# Patient Record
Sex: Female | Born: 1975 | Race: White | Hispanic: Yes | Marital: Single | Smoking: Current every day smoker
Health system: Southern US, Community
[De-identification: ages and names within clinical notes are randomized; demographics above are authoritative.]

## PROBLEM LIST (undated history)

## (undated) DIAGNOSIS — F329 Major depressive disorder, single episode, unspecified: Secondary | ICD-10-CM

## (undated) DIAGNOSIS — R102 Pelvic and perineal pain: Secondary | ICD-10-CM

## (undated) DIAGNOSIS — G8929 Other chronic pain: Secondary | ICD-10-CM

## (undated) DIAGNOSIS — F32A Depression, unspecified: Secondary | ICD-10-CM

## (undated) DIAGNOSIS — D219 Benign neoplasm of connective and other soft tissue, unspecified: Secondary | ICD-10-CM

## (undated) DIAGNOSIS — N83209 Unspecified ovarian cyst, unspecified side: Secondary | ICD-10-CM

## (undated) DIAGNOSIS — B009 Herpesviral infection, unspecified: Secondary | ICD-10-CM

## (undated) HISTORY — PX: TUBAL LIGATION: SHX77

## (undated) HISTORY — PX: TONSILLECTOMY: SUR1361

## (undated) HISTORY — PX: OTHER SURGICAL HISTORY: SHX169

## (undated) HISTORY — DX: Herpesviral infection, unspecified: B00.9

---

## 2013-12-30 ENCOUNTER — Ambulatory Visit (INDEPENDENT_AMBULATORY_CARE_PROVIDER_SITE_OTHER): Payer: Medicaid Other | Admitting: Obstetrics

## 2013-12-30 ENCOUNTER — Encounter: Payer: Self-pay | Admitting: Obstetrics

## 2013-12-30 VITALS — BP 121/82 | HR 66 | Temp 99.2°F | Ht 68.0 in | Wt 182.0 lb

## 2013-12-30 DIAGNOSIS — Z113 Encounter for screening for infections with a predominantly sexual mode of transmission: Secondary | ICD-10-CM

## 2013-12-30 DIAGNOSIS — N926 Irregular menstruation, unspecified: Secondary | ICD-10-CM

## 2013-12-30 NOTE — Progress Notes (Signed)
Subjective:     Stacie Brown is a 38 y.o. female here for a routine exam.  Current complaints: Patient is new to the area and would like to establish care. Patient is due for annual pap and would like STD testing..  Personal health questionnaire reviewed: yes.   Gynecologic History Patient's last menstrual period was 12/25/2013. Contraception: none Last Pap: 02/2013. Results were: normal Last mammogram: never.   Obstetric History OB History  No data available     The following portions of the patient's history were reviewed and updated as appropriate: allergies, current medications, past family history, past medical history, past social history, past surgical history and problem list.  Review of Systems Pertinent items are noted in HPI.    Objective:    General appearance: alert and no distress Abdomen: normal findings: soft, non-tender Pelvic: cervix normal in appearance, external genitalia normal, no adnexal masses or tenderness, no cervical motion tenderness, rectovaginal septum normal, uterus normal size, shape, and consistency and vagina normal without discharge    Assessment:    Healthy female exam.   Screening for venereal disease   Plan:    Education reviewed: safe sex/STD prevention. Contraception: none. Follow up in: several months. STD Panel

## 2013-12-31 LAB — HEPATITIS B SURFACE ANTIGEN: Hepatitis B Surface Ag: NEGATIVE

## 2013-12-31 LAB — GC/CHLAMYDIA PROBE AMP
CT Probe RNA: NEGATIVE
GC Probe RNA: NEGATIVE

## 2013-12-31 LAB — WET PREP BY MOLECULAR PROBE
CANDIDA SPECIES: NEGATIVE
GARDNERELLA VAGINALIS: POSITIVE — AB
TRICHOMONAS VAG: NEGATIVE

## 2013-12-31 LAB — HIV ANTIBODY (ROUTINE TESTING W REFLEX): HIV: NONREACTIVE

## 2013-12-31 LAB — HEPATITIS C ANTIBODY: HCV Ab: NEGATIVE

## 2013-12-31 LAB — RPR

## 2014-01-05 ENCOUNTER — Other Ambulatory Visit: Payer: Self-pay | Admitting: *Deleted

## 2014-01-05 DIAGNOSIS — N760 Acute vaginitis: Principal | ICD-10-CM

## 2014-01-05 DIAGNOSIS — B9689 Other specified bacterial agents as the cause of diseases classified elsewhere: Secondary | ICD-10-CM

## 2014-01-05 MED ORDER — METRONIDAZOLE 500 MG PO TABS: 500.0000 mg | ORAL_TABLET | Freq: Two times a day (BID) | ORAL | Status: DC

## 2014-02-26 ENCOUNTER — Encounter (HOSPITAL_COMMUNITY): Payer: Self-pay | Admitting: Emergency Medicine

## 2014-02-26 ENCOUNTER — Emergency Department (HOSPITAL_COMMUNITY)
Admission: EM | Admit: 2014-02-26 | Discharge: 2014-02-27 | Disposition: A | Discharge status: Other Acute Inpatient Hospital | Payer: Medicaid Other | Attending: Emergency Medicine | Admitting: Emergency Medicine

## 2014-02-26 DIAGNOSIS — F3289 Other specified depressive episodes: Secondary | ICD-10-CM | POA: Insufficient documentation

## 2014-02-26 DIAGNOSIS — F32A Depression, unspecified: Secondary | ICD-10-CM

## 2014-02-26 DIAGNOSIS — Z3202 Encounter for pregnancy test, result negative: Secondary | ICD-10-CM | POA: Insufficient documentation

## 2014-02-26 DIAGNOSIS — Z79899 Other long term (current) drug therapy: Secondary | ICD-10-CM | POA: Insufficient documentation

## 2014-02-26 DIAGNOSIS — F1210 Cannabis abuse, uncomplicated: Secondary | ICD-10-CM | POA: Insufficient documentation

## 2014-02-26 DIAGNOSIS — R45851 Suicidal ideations: Secondary | ICD-10-CM | POA: Insufficient documentation

## 2014-02-26 DIAGNOSIS — F411 Generalized anxiety disorder: Secondary | ICD-10-CM | POA: Insufficient documentation

## 2014-02-26 DIAGNOSIS — F329 Major depressive disorder, single episode, unspecified: Secondary | ICD-10-CM | POA: Insufficient documentation

## 2014-02-26 DIAGNOSIS — F17200 Nicotine dependence, unspecified, uncomplicated: Secondary | ICD-10-CM | POA: Insufficient documentation

## 2014-02-26 DIAGNOSIS — Z8619 Personal history of other infectious and parasitic diseases: Secondary | ICD-10-CM | POA: Insufficient documentation

## 2014-02-26 HISTORY — DX: Major depressive disorder, single episode, unspecified: F32.9

## 2014-02-26 HISTORY — DX: Depression, unspecified: F32.A

## 2014-02-26 LAB — RAPID URINE DRUG SCREEN, HOSP PERFORMED
AMPHETAMINES: NOT DETECTED
BARBITURATES: NOT DETECTED
BENZODIAZEPINES: NOT DETECTED
Cocaine: NOT DETECTED
Opiates: NOT DETECTED
Tetrahydrocannabinol: POSITIVE — AB

## 2014-02-26 LAB — COMPREHENSIVE METABOLIC PANEL
ALBUMIN: 3.2 g/dL — AB (ref 3.5–5.2)
ALT: 12 U/L (ref 0–35)
AST: 13 U/L (ref 0–37)
Alkaline Phosphatase: 55 U/L (ref 39–117)
BUN: 11 mg/dL (ref 6–23)
CO2: 26 meq/L (ref 19–32)
Calcium: 9 mg/dL (ref 8.4–10.5)
Chloride: 103 mEq/L (ref 96–112)
Creatinine, Ser: 0.88 mg/dL (ref 0.50–1.10)
GFR calc Af Amer: >90 mL/min (ref >90)
GFR, EST NON AFRICAN AMERICAN: 83 mL/min — AB (ref >90)
Glucose, Bld: 105 mg/dL — ABNORMAL HIGH (ref 70–99)
POTASSIUM: 3.6 meq/L — AB (ref 3.7–5.3)
SODIUM: 139 meq/L (ref 137–147)
Total Protein: 5.5 g/dL — ABNORMAL LOW (ref 6.0–8.3)

## 2014-02-26 LAB — CBC
HEMATOCRIT: 40 % (ref 36.0–46.0)
Hemoglobin: 13.8 g/dL (ref 12.0–15.0)
MCH: 29.7 pg (ref 26.0–34.0)
MCHC: 34.5 g/dL (ref 30.0–36.0)
MCV: 86.2 fL (ref 78.0–100.0)
Platelets: 218 10*3/uL (ref 150–400)
RBC: 4.64 MIL/uL (ref 3.87–5.11)
RDW: 12.3 % (ref 11.5–15.5)
WBC: 7.2 10*3/uL (ref 4.0–10.5)

## 2014-02-26 LAB — ACETAMINOPHEN LEVEL

## 2014-02-26 LAB — SALICYLATE LEVEL

## 2014-02-26 LAB — POC URINE PREG, ED: Preg Test, Ur: NEGATIVE

## 2014-02-26 LAB — ETHANOL: Alcohol, Ethyl (B): <11 mg/dL (ref 0–11)

## 2014-02-26 NOTE — ED Notes (Signed)
Called lab for bloodwork results.

## 2014-02-26 NOTE — ED Notes (Signed)
Pt reports a history of depression. She has been working with a  Teacher, music on a care plan but this week things got worse and she is feeling suicidal so she came to ED today for help. She has not tried to kill herself but thought of taking too many of her pills. Denies HI. She is having a lot of family problems at home. Denies any substance use

## 2014-02-26 NOTE — ED Notes (Signed)
Pt to receive supper tray.
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
## 2014-02-26 NOTE — ED Notes (Signed)
Dr Stevie Kern given lab results.  [place] notified.  [facility] called for transport.

## 2014-02-26 NOTE — ED Provider Notes (Signed)
CSN: [id number]     Arrival date & time 02/26/14  1616 History   First MD Initiated Contact with Patient 02/26/14 1634     Chief Complaint  Patient presents with  . Depression     (Consider location/radiation/quality/duration/timing/severity/associated sxs/prior Treatment) HPI 38 year old female with history of anxiety depression bipolar disorder worst impression this week with suicidal ideation with thoughts overdose the last few days talk to her psychiatric counselor who advised the patient come to the emergency department. The patient denies any threats to harm others denies any hallucinations denies any other medical concerns at this time. There is no treatment prior to arrival. Patient denies overdose prior to arrival. Her depression is severe. Past Medical History  Diagnosis Date  . Herpes   . Depression    Past Surgical History  Procedure Laterality Date  . Laproscopic Bilateral     cyst on tubes removed  . Tubal ligation    . Tonsillectomy     Family History  Problem Relation Age of Onset  . Diabetes Mother   . Asthma Mother   . Cancer Mother   . Heart disease Father    History  Substance Use Topics  . Smoking status: Heavy Tobacco Smoker -- 0.50 packs/day    Types: Cigarettes  . Smokeless tobacco: Not on file  . Alcohol Use: No   OB History   Grav Para Term Preterm Abortions TAB SAB Ect Mult Living                 Review of Systems 10 Systems reviewed and are negative for acute change except as noted in the HPI.   Allergies  Morphine and related  Home Medications   Prior to Admission medications   Medication Sig Start Date End Date Taking? Authorizing Provider  metroNIDAZOLE (FLAGYL) 500 MG tablet Take 1 tablet (500 mg total) by mouth 2 (two) times daily. 01/05/14   Shelly Bombard, MD   BP 93/55  Pulse 66  Temp(Src) 97.7 F (36.5 C) (Oral)  Resp 20  Ht 5' 8"  (1.727 m)  Wt 178 lb 12.8 oz (81.103 kg)  BMI 27.19 kg/m2  SpO2 99%  LMP  02/25/2014 Physical Exam  Nursing note and vitals reviewed. Constitutional:  Awake, alert, nontoxic appearance.  HENT:  Head: Atraumatic.  Eyes: Right eye exhibits no discharge. Left eye exhibits no discharge.  Neck: Neck supple.  Cardiovascular: Normal rate and regular rhythm.   No murmur heard. Pulmonary/Chest: Effort normal and breath sounds normal. No respiratory distress. She has no wheezes. She has no rales. She exhibits no tenderness.  Abdominal: Soft. There is no tenderness. There is no rebound.  Musculoskeletal: She exhibits no tenderness.  Baseline ROM, no obvious new focal weakness.  Neurological: She is alert.  Mental status and motor strength appears baseline for patient and situation.  Skin: No rash noted.  Psychiatric:  Appears somewhat anxious and depressed with suicidal ideation but states she will cooperate and will not harm her self in the emergency department denies any threats to harm others denies any hallucinations; speech is clear and does not appear pressured or tangential    ED Course  Procedures (including critical care time) Accepted at [facility] and Pt agrees. [place] RAPID DRUG SCREEN (HOSP PERFORMED) - Abnormal; Notable for the following:    Tetrahydrocannabinol POSITIVE (*)    All other components within normal limits  COMPREHENSIVE METABOLIC PANEL - Abnormal; Notable for the following:    Potassium  3.6 (*)    Glucose, Bld 105 (*)    Total Protein 5.5 (*)    Albumin 3.2 (*)    Total Bilirubin <0.2 (*)    GFR calc non Af Amer 83 (*)    All other components within normal limits  SALICYLATE LEVEL - Abnormal; Notable for the following:    Salicylate Lvl <6.1 (*)    All other components within normal limits  CBC  ACETAMINOPHEN LEVEL  ETHANOL  ACETAMINOPHEN LEVEL  COMPREHENSIVE METABOLIC PANEL  ETHANOL  SALICYLATE LEVEL  POC URINE PREG, ED    Imaging Review No results found.   EKG Interpretation None       MDM   Final diagnoses:  Suicidal ideation  Depression    The patient appears reasonably stabilized for transfer considering the current resources, flow, and capabilities available in the ED at this time, and I doubt any other [facility] requiring further screening and/or treatment in the ED prior to transfer.    Babette Relic, MD 03/03/14 [phone]

## 2014-02-26 NOTE — ED Notes (Signed)
Report given to [facility], RN in pod C. Pt will be moved to room 21

## 2014-02-26 NOTE — ED Notes (Signed)
Pt has been wanded by security. Belongings are bagged and at nurses' station. Pt in room with sitter and in blue paper scrubs

## 2014-02-27 ENCOUNTER — Inpatient Hospital Stay (HOSPITAL_COMMUNITY)
Admission: AD | Admit: 2014-02-27 | Discharge: 2014-03-04 | DRG: 885 | Disposition: A | Discharge status: Home / Self Care | Payer: Medicaid Other | Source: Intra-hospital | Attending: Psychiatry | Admitting: Psychiatry

## 2014-02-27 ENCOUNTER — Encounter (HOSPITAL_COMMUNITY): Payer: Self-pay | Admitting: *Deleted

## 2014-02-27 DIAGNOSIS — Z8249 Family history of ischemic heart disease and other diseases of the circulatory system: Secondary | ICD-10-CM

## 2014-02-27 DIAGNOSIS — F17200 Nicotine dependence, unspecified, uncomplicated: Secondary | ICD-10-CM | POA: Diagnosis present

## 2014-02-27 DIAGNOSIS — R45851 Suicidal ideations: Secondary | ICD-10-CM

## 2014-02-27 DIAGNOSIS — Z833 Family history of diabetes mellitus: Secondary | ICD-10-CM

## 2014-02-27 DIAGNOSIS — F3164 Bipolar disorder, current episode mixed, severe, with psychotic features: Principal | ICD-10-CM | POA: Diagnosis present

## 2014-02-27 DIAGNOSIS — IMO0002 Reserved for concepts with insufficient information to code with codable children: Secondary | ICD-10-CM

## 2014-02-27 DIAGNOSIS — Z598 Other problems related to housing and economic circumstances: Secondary | ICD-10-CM

## 2014-02-27 DIAGNOSIS — G4700 Insomnia, unspecified: Secondary | ICD-10-CM | POA: Diagnosis present

## 2014-02-27 DIAGNOSIS — F411 Generalized anxiety disorder: Secondary | ICD-10-CM | POA: Diagnosis present

## 2014-02-27 DIAGNOSIS — F410 Panic disorder [episodic paroxysmal anxiety] without agoraphobia: Secondary | ICD-10-CM | POA: Diagnosis present

## 2014-02-27 DIAGNOSIS — F4310 Post-traumatic stress disorder, unspecified: Secondary | ICD-10-CM | POA: Diagnosis present

## 2014-02-27 DIAGNOSIS — F3110 Bipolar disorder, current episode manic without psychotic features, unspecified: Secondary | ICD-10-CM

## 2014-02-27 DIAGNOSIS — Z5987 Material hardship due to limited financial resources, not elsewhere classified: Secondary | ICD-10-CM

## 2014-02-27 DIAGNOSIS — Z825 Family history of asthma and other chronic lower respiratory diseases: Secondary | ICD-10-CM

## 2014-02-27 MED ORDER — MAGNESIUM HYDROXIDE 400 MG/5ML PO SUSP: 30.0000 mL | Freq: Every day | ORAL | Status: DC | PRN

## 2014-02-27 MED ORDER — ALUM & MAG HYDROXIDE-SIMETH 200-200-20 MG/5ML PO SUSP: 30.0000 mL | ORAL | Status: DC | PRN

## 2014-02-27 MED ORDER — ACETAMINOPHEN 325 MG PO TABS: 650.0000 mg | ORAL_TABLET | Freq: Four times a day (QID) | ORAL | Status: DC | PRN

## 2014-02-27 MED ORDER — PERPHENAZINE 4 MG PO TABS
4.0000 mg | ORAL_TABLET | Freq: Every day | ORAL | Status: DC
  Administered 2014-02-27: 4 mg via ORAL
  Filled 2014-02-27 (×2): qty 1

## 2014-02-27 MED ORDER — BUSPIRONE HCL 15 MG PO TABS
15.0000 mg | ORAL_TABLET | Freq: Three times a day (TID) | ORAL | Status: DC
  Administered 2014-02-27 – 2014-02-28 (×3): 15 mg via ORAL
  Filled 2014-02-27 (×7): qty 1

## 2014-02-27 NOTE — [facility] Group Notes (Signed)
[facility] LCSW Group Therapy  02/27/2014 11:15 AM  Type of Therapy:  Group Therapy  Participation Level:  Minimal  Participation Quality:  Inattentive and Monopolizing  Affect:  Flat  Cognitive:  Alert  Insight:  Poor  Engagement in Therapy:  Limited  Modes of Intervention:  Orientation, Rapport Building and Socialization  Summary of Progress/Problems: Group focus today was on self care. There was discussion on what individual group members saw as self care and patient were then given opportunity to identify an area they could focus on upon discharge. Patient came into to group after midpoint and shared little. What she did shared was stated with blunt affect and pt avoided eye contact  Lyla Glassing
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
## 2014-02-27 NOTE — Progress Notes (Signed)
Patient ID: Stacie Brown, female   DOB: 1976-01-05, 38 y.o.   MRN: [id number] Psychoeducational Group Note  Date:  02/27/2014 Time:  0900  Group Topic/Focus:  inventory  group   Participation Level: Did Not Attend  Participation Quality:  Not Applicable  Affect:  Not Applicable  Cognitive:  Not Applicable  Insight:  Not Applicable  Engagement in Group: Not Applicable  Additional Comments:  Did not attend.   Pricilla Larsson 02/27/2014, 11:23 AM

## 2014-02-27 NOTE — Progress Notes (Signed)
Adult Psychoeducational Group Note  Date:  02/27/2014 Time:  9:49 PM  Group Topic/Focus:  Wrap-Up Group:   The focus of this group is to help patients review their daily goal of treatment and discuss progress on daily workbooks.  Participation Level:  Active  Participation Quality:  Appropriate  Affect:  Appropriate  Cognitive:  Appropriate  Insight: Appropriate  Engagement in Group:  Engaged  Modes of Intervention:  Discussion  Additional Comments: The patient expressed that she learn from group that you need to make positive decisions.The patient said that she is struggling with taking her medications.  Thayer Dallas Kassey Laforest 02/27/2014, 9:49 PM
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
## 2014-02-27 NOTE — Progress Notes (Signed)
Adult Psychoeducational Group Note  Date:  02/27/2014 Time:  4:30 PM  Group Topic/Focus:  Managing Feelings:   The focus of this group is to identify what feelings patients have difficulty handling and develop a plan to handle them in a healthier way upon discharge.  Participation Level:  Active  Participation Quality:  Appropriate and Attentive  Affect:  Appropriate  Cognitive:  Alert and Appropriate  Insight: Appropriate and Good  Engagement in Group:  Engaged and Supportive  Modes of Intervention:  Education and Support  Additional Comments:  Pt came to group   Stacie Brown 02/27/2014, 4:30 PM

## 2014-02-27 NOTE — ED Notes (Signed)
Patient left ED with personal belongings with [facility] transport to [facility].

## 2014-02-27 NOTE — Progress Notes (Signed)
Patient ID: Stacie Brown, female   DOB: 09-22-1976, 39 y.o.   MRN: [id number] Psychoeducational Group Note  Date:  02/27/2014 Time:  0920  Group Topic/Focus:  healthy support systems  Participation Level: Did Not Attend  Participation Quality:  Not Applicable  Affect:  Not Applicable  Cognitive:  Not Applicable  Insight:  Not Applicable  Engagement in Group: Not Applicable  Additional Comments:  Did not attend   Pricilla Larsson 02/27/2014, 11:24 AM

## 2014-02-27 NOTE — Tx Team (Signed)
Initial Interdisciplinary Treatment Plan  PATIENT STRENGTHS: (choose at least two) Capable of independent living Communication skills Financial means General fund of knowledge  PATIENT STRESSORS: Marital or family conflict Medication change or noncompliance   PROBLEM LIST: Problem List/Patient Goals Date to be addressed Date deferred Reason deferred Estimated date of resolution  Suicidal ideation 02/27/14     Depression 02/27/14                                                DISCHARGE CRITERIA:  Improved stabilization in mood, thinking, and/or behavior Need for constant or close observation no longer present Verbal commitment to aftercare and medication compliance  PRELIMINARY DISCHARGE PLAN: Outpatient therapy Return to previous living arrangement  PATIENT/FAMIILY INVOLVEMENT: This treatment plan has been presented to and reviewed with the patient, [facility]   Carroll Sage 02/27/2014, 2:20 AM

## 2014-02-27 NOTE — Progress Notes (Signed)
Patient ID: Stacie Brown, female   DOB: [birth year], 38 y.o.   MRN: [id number] D. Patient presents with depressed mood, affect blunted. [name] has been isolative and withdrawn throughout most of shift but has attempted to attend unit programming. She continues to endorse her mood as depressed, and states '' I'm worried that my sister is mad at me for coming in here, but I need help '' She completed self inventory and rates depression at 8/10 on depression scale. She continues to report paranoid thoughts as well as auditory hallucinations '' I hear family members calling my name '' She denies any VH . She reports vague fleeting SI but contracts for safety. A. Medications given as ordered. Support and encouragement provided . R. Patient is in no acute distress at this time. Will continue to monitor q 15 minutes for safety.
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
## 2014-02-28 MED ORDER — BUSPIRONE HCL 10 MG PO TABS
10.0000 mg | ORAL_TABLET | Freq: Three times a day (TID) | ORAL | Status: DC
  Administered 2014-02-28 – 2014-03-04 (×12): 10 mg via ORAL
  Filled 2014-02-28: qty 9
  Filled 2014-02-28 (×2): qty 1
  Filled 2014-02-28: qty 9
  Filled 2014-02-28: qty 1
  Filled 2014-02-28: qty 9
  Filled 2014-02-28 (×8): qty 1
  Filled 2014-02-28: qty 2
  Filled 2014-02-28 (×4): qty 1

## 2014-02-28 MED ORDER — SERTRALINE HCL 50 MG PO TABS
50.0000 mg | ORAL_TABLET | Freq: Every day | ORAL | Status: DC
  Administered 2014-02-28 – 2014-03-01 (×2): 50 mg via ORAL
  Filled 2014-02-28 (×4): qty 1

## 2014-02-28 MED ORDER — PERPHENAZINE 4 MG PO TABS
4.0000 mg | ORAL_TABLET | Freq: Two times a day (BID) | ORAL | Status: DC
  Administered 2014-02-28 (×2): 4 mg via ORAL
  Filled 2014-02-28 (×4): qty 1

## 2014-02-28 MED ORDER — PERPHENAZINE 4 MG PO TABS
4.0000 mg | ORAL_TABLET | Freq: Two times a day (BID) | ORAL | Status: DC
  Administered 2014-03-01 – 2014-03-04 (×7): 4 mg via ORAL
  Filled 2014-02-28 (×5): qty 1
  Filled 2014-02-28: qty 6
  Filled 2014-02-28 (×2): qty 1
  Filled 2014-02-28: qty 6
  Filled 2014-02-28 (×2): qty 1

## 2014-02-28 NOTE — Progress Notes (Signed)
D   Pt interacts with select peers   She appears sad and depressed   She requested that her medication perfenazine be scheduled for bedtime because it helps her to sleep   She is active in groups and is developing a more positive attitude regarding her treatment   Pt continues to endorse occasional suicidal thoughts A   Verbal support given   Discussed medications with pt   Administered medications and evaluate effectiveness   Q 15 min checks R   Pt verbalized understanding and is presently safe
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
## 2014-02-28 NOTE — Progress Notes (Signed)
The focus of this group is to help patients review their daily goal of treatment and discuss progress on daily workbooks. Pt attended the evening group session and responded to all discussion prompts from the [place]. Pt shared that today was not a good day because nothing good happened for her (such as phone calls or visits from friends/family), which she answered while smiling. When asked about her smile being incongruent with her words, Pt stated that she was smiling for herself because she was strong. Pt reported having no additional needs from Nursing Staff this evening. Pt's affect was positive and pleasant despite her bad day.

## 2014-02-28 NOTE — Progress Notes (Signed)
D: Pt reports feeling suicidal. Pt verbally contracts for safety. Pt presents with flat affect and depressed mood. Pt appears withdrawn at times and often isolates in her room. Pt has poor insight for treatment and confused to how she is feeling today.  Pt has minimal interaction on the milieu. A: Medications administered as ordered per MD. Verbal support given. Pt encouraged to attend groups. Pt encouraged to report any worsening symptoms to Probation officer or staff.15 minute checks performed for safety. R: Pt safety maintained at this time.
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
## 2014-02-28 NOTE — BHH Group Notes (Signed)
[facility] LCSW Group Therapy  02/28/2014 1:15 pm  Type of Therapy: Process Group Therapy  Participation Level:  Minimal  Participation Quality:  Appropriate  Affect:  Flat  Cognitive:  Oriented  Insight:  Improving  Engagement in Group:  Limited  Engagement in Therapy:  Limited  Modes of Intervention:  Activity, Clarification, Education, Problem-solving and Support  Summary of Progress/Problems: Today's group addressed the issue of overcoming obstacles.  Patients were asked to identify their biggest obstacle post d/c that stands in the way of their on-going success, and then problem solve as to how to manage this.  Stacie Brown had nothing to contribute spontaneously, and minimal when questioned directly. Stated that her biggest obstacles are her mother and sister, and that her goal is to get her own place.  "Otherwise I will be depressed."  Stacie Brown 02/28/2014   4:22 PM

## 2014-03-01 ENCOUNTER — Encounter (HOSPITAL_COMMUNITY): Payer: Self-pay | Admitting: Psychiatry

## 2014-03-01 DIAGNOSIS — R45851 Suicidal ideations: Secondary | ICD-10-CM

## 2014-03-01 DIAGNOSIS — F3164 Bipolar disorder, current episode mixed, severe, with psychotic features: Principal | ICD-10-CM

## 2014-03-01 MED ORDER — SERTRALINE HCL 25 MG PO TABS
75.0000 mg | ORAL_TABLET | Freq: Every day | ORAL | Status: DC
  Administered 2014-03-02 – 2014-03-03 (×2): 75 mg via ORAL
  Filled 2014-03-01 (×3): qty 3

## 2014-03-01 NOTE — [facility] Group Notes (Signed)
[facility] LCSW Group Therapy  03/01/2014 , 12:43 PM   Type of Therapy:  Group Therapy  Participation Level:  Active  Participation Quality:  Attentive  Affect:  Appropriate  Cognitive:  Alert  Insight:  Limited  Engagement in Therapy:  Minimal  Modes of Intervention:  Discussion, Exploration and Socialization  Summary of Progress/Problems: Today's group focused on the term Diagnosis.  Participants were asked to define the term, and then pronounce whether it is a negative, positive or neutral term.  Stacie Brown stayed for the entire group.  She sat quietly and did not participate.  When asked directly, she stated that she is not sure what diagnosis means, nor if she has one, though did admit that "I feel depressed."    Roque Lias B 03/01/2014 , 12:43 PM

## 2014-03-01 NOTE — Progress Notes (Signed)
D    Pt is bright and pleasant on approach  She attends groups and participates   She expressed disapointment  Over not being discharged tomorrow but said the doctor said she would be discharged Thursday or Friday   She reports she was very irritable yesterday but after getting a good nights sleep she feels more like herself and feels calmer    A   Verbal support given   Medications administered and effectiveness monitored  Educate on sleep and wellness issues   Q 15 min checks R   Pt is safe and verbalized understanding
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
## 2014-03-01 NOTE — Progress Notes (Signed)
[facility] Group Notes:  (Nursing/MHT/Case Management/Adjunct)  Date:  03/01/2014  Time:  10:02 PM  Type of Therapy:  Psychoeducational Skills  Participation Level:  Active  Participation Quality:  Appropriate  Affect:  Appropriate  Cognitive:  Appropriate  Insight:  Appropriate  Engagement in Group:  Engaged  Modes of Intervention:  Orientation and Support  Summary of Progress/Problems: Patient stated that she "had an excellent day". She mentioned that she enjoyed playing cards and socializing with her peers. Patient made the statement when asked "how can you better yourself after leaving the facility?" that she will try her best to stay positive whenever there is negative situation.    Zettie Pho 03/01/2014, 10:02 PM

## 2014-03-01 NOTE — [facility] Group Notes (Signed)
[facility] Group Notes:  (Nursing/MHT/Case Management/Adjunct)  Date:  03/01/2014  Time:  9:52 AM  Type of Therapy:  Psychoeducational Skills  Participation Level:  Active  Participation Quality:  Appropriate and Attentive  Affect:  Appropriate  Cognitive:  Appropriate  Insight:  Appropriate and Good  Engagement in Group:  Engaged  Modes of Intervention:  Clarification  Summary of Progress/Problems: Morning Wellness; good insight  Ellery Plunk Kou Gucciardo 03/01/2014, 9:52 AM
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
## 2014-03-01 NOTE — Progress Notes (Signed)
Patient ID: Stacie Brown, female   DOB: 03-21-1976, 38 y.o.   MRN: [id number] [facility] MD Progress Note  03/01/2014 11:22 AM VERDIS BASSETTE  MRN:  [id number] Subjective:   Patient states "I was feeling stressed out taking care of my sick mother and sister, being in the hospital and taking my medication regularly is helping me. I hope to get my own apartment when I get out of here.''   Objective:  Patient is seen and chart is reviewed. She reports ongoing depressive symptoms, characterized by hopelessness, feeling worthless and having difficulty sleeping. She also endorsed anxiety,  paranoia, auditory hallucinations and  mood swings. She rates her depression and anxiety at 6/10. Patient reports being preoccupied with intrusive thoughts and feels overwhelmed by taking care of her mentally ill family members.She is compliant with her medications and has not endorsed any adverse reactions.  Diagnosis:   DSM5: Total Time spent with patient: 25 minutes Axis I: Bipolar 1 disorder, most recent episode mixed, severe, specified as with psychotic behavior  Axis II: Deferred Axis III:  Past Medical History  Diagnosis Date  . Herpes    Axis IV: economic problems, housing problems, occupational problems, other psychosocial or environmental problems, problems related to social environment and problems with primary support group Axis V: 41-50 serious symptoms  ADL's:  Intact  Sleep: Fair  Appetite:  Fair  Suicidal Ideation:  Passive SI with plan  Homicidal Ideation:  Denies AEB (as evidenced by):  Psychiatric Specialty Exam: Physical Exam  Review of Systems  Constitutional: Negative.   HENT: Negative.   Eyes: Negative.   Respiratory: Negative.   Cardiovascular: Negative.   Gastrointestinal: Negative.   Genitourinary: Negative.   Musculoskeletal: Negative.   Skin: Negative.   Neurological: Negative.   Endo/Heme/Allergies: Negative.   Psychiatric/Behavioral: Positive for depression, suicidal  ideas and substance abuse (UDS positive for marijuana ). Negative for hallucinations and memory loss. The patient is nervous/anxious and has insomnia.     Blood pressure 105/69, pulse 87, temperature 97.7 F (36.5 C), temperature source Oral, resp. rate 20, height 5' 7.5" (1.715 m), weight 79.379 kg (175 lb), last menstrual period 02/25/2014.Body mass index is 26.99 kg/(m^2).  General Appearance: Disheveled  Eye Sport and exercise psychologist::  Fair  Speech:  Clear and Coherent  Volume:  Normal  Mood:  Dysphoric  Affect:  Tearful  Thought Process:  Goal Directed and Intact  Orientation:  Full (Time, Place, and Person)  Thought Content:  Delusions, Hallucinations: Auditory and Paranoid Ideation  Suicidal Thoughts:  Yes.  without intent/plan  Homicidal Thoughts:  No  Memory:  Immediate;   Good Recent;   Good Remote;   Good  Judgement:  Impaired  Insight:  Shallow  Psychomotor Activity:  Decreased  Concentration:  Fair  Recall:  [employer] of Knowledge:Fair  Language: Fair  Akathisia:  Negative  Handed:  Right  AIMS (if indicated):     Assets:  Communication Skills Desire for Improvement Housing  Sleep:  Number of Hours: 6.25   Musculoskeletal: Strength & Muscle Tone: within normal limits Gait & Station: normal Patient leans: N/A  Current Medications: Current Facility-Administered Medications  Medication Dose Route Frequency Provider Last Rate Last Dose  . acetaminophen (TYLENOL) tablet 650 mg  650 mg Oral Q6H PRN Lurena Nida, NP      . alum & mag hydroxide-simeth (MAALOX/MYLANTA) 200-200-20 MG/5ML suspension 30 mL  30 mL Oral Q4H PRN Lurena Nida, NP      . busPIRone (BUSPAR) tablet 10 mg  10 mg Oral TID Elmarie Shiley, NP   10 mg at 03/01/14 1062  . magnesium hydroxide (MILK OF MAGNESIA) suspension 30 mL  30 mL Oral Daily PRN Lurena Nida, NP      . perphenazine (TRILAFON) tablet 4 mg  4 mg Oral BID Dereon Williamsen   4 mg at 03/01/14 0807  . sertraline (ZOLOFT) tablet 50 mg  50 mg Oral Daily  Elmarie Shiley, NP   50 mg at 03/01/14 6948    Lab Results:  No results found for this or any previous visit (from the past 48 hour(s)).  Physical Findings: AIMS:  , ,  ,  ,    CIWA:    COWS:     Treatment Plan Summary: Daily contact with patient to assess and evaluate symptoms and progress in treatment Medication management  Plan: 1. Continue crisis management and stabilization.  2. Medication management: - Increase  Zoloft to 75 mg daily for depression/obsessive thoughts. -Continue Trilafon to 4 mg BID for psychosis. -Continue  Buspar 10 mg TID for anxiety.  3. Encouraged patient to attend groups and participate in group counseling sessions and activities.  4. Discharge plan in progress.  5. Continue current treatment plan.  6. Address health issues: Vitals reviewed and stable.   Medical Decision Making Problem Points:  Established problem, improving (1), Review of last therapy session (1) and Review of psycho-social stressors (1) Data Points:  Order Aims Assessment (2) Review or order clinical lab tests (1) Review of medication regiment & side effects (2) Review of new medications or change in dosage (2)  I certify that inpatient services furnished can reasonably be expected to improve the patient's condition.   Corena Pilgrim, MD 03/01/2014, 11:22 AM
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
## 2014-03-02 NOTE — Progress Notes (Signed)
Pt attended group but did not share much in group.
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
## 2014-03-02 NOTE — [facility] Group Notes (Signed)
[facility] LCSW Aftercare Discharge Planning Group Note   03/02/2014 11:36 AM  Participation Quality:  Did not attend    Trish Mage

## 2014-03-02 NOTE — BHH Suicide Risk Assessment (Signed)
[place] INPATIENT:  Family/Significant Other Suicide Prevention Education  Suicide Prevention Education:  Patient Refusal for Family/Significant Other Suicide Prevention Education: The patient Stacie Brown has refused to provide written consent for family/significant other to be provided Family/Significant Other Suicide Prevention Education during admission and/or prior to discharge.  Physician notified.  Cassandria Santee 03/02/2014, 3:33 PM

## 2014-03-02 NOTE — BHH Group Notes (Signed)
[facility] Mental Health Association Group Therapy  03/02/2014  1:09 PM  Type of Therapy:  Mental Health Association Presentation   Participation Level:  Active  Participation Quality:  Attentive  Affect:  Appropriate  Cognitive:  Appropriate  Insight:  Engaged  Engagement in Therapy:  Engaged  Modes of Intervention:  Discussion, Education and Socialization   Summary of Progress/Problems:  Shanon Brow from [facility] came to present his recovery story and play the guitar.  Andrya listened quietly while the speaker told his story.  Delmi asked the speaker if he was still "sick" and taking medications.  She shared that she is struggling on whether it would be better for her to work or receive disability checks.  Briena was provided information about Vocational Rehab to navigate through employment.   She smiled while the speaker played his guitar and applauded after every song.  She encouraged other group members to play the piano.    Cassandria Santee   03/02/2014  1:09 PM

## 2014-03-02 NOTE — Progress Notes (Signed)
D: Pt presents with flat affect and depressed mood. Pt rates depression 4/10 and hopeless 4/10. Pt reports decreasing depression today. Pt has minimal interaction on the milieu. Pt reports poor sleep last night d/t it been noisy on the hallway.  Pt reports feeling depressed about her home situations. Per pt, she have learned to care for herself more than others. Pt also plan on saving her money so that she can find an income based apt. Pt compliant with taking meds and attending groups. A: Medications administered as ordered per MD. Verbal support given. Pt encouraged to attend groups . 15 minute checks performed for safety. R; Pt safety maintained.

## 2014-03-03 MED ORDER — SERTRALINE HCL 100 MG PO TABS
100.0000 mg | ORAL_TABLET | Freq: Every day | ORAL | Status: DC
  Administered 2014-03-04: 100 mg via ORAL
  Filled 2014-03-03: qty 3
  Filled 2014-03-03 (×2): qty 1

## 2014-03-03 NOTE — Progress Notes (Signed)
[facility] Adult Case Management Discharge Plan :  Will you be returning to the same living situation after discharge: Yes,  home At discharge, do you have transportation home?:Yes,  bus pass Do you have the ability to pay for your medications:Yes,  MCD  Release of information consent forms completed and in the chart;  Patient's signature needed at discharge.  Patient to Follow up at: Follow-up Information   Follow up with [facility]. (Go to the walk-in clinic M-F between 8 and 9am for your hospital follow-up appointment.  )    Contact information:   [address] [phone]      Follow up with [facility]  On 03/11/2014. (11am.  Go see your therapist, Mal Amabile  )    Contact information:   [place] [[phone]] [phone]       Patient denies SI/HI:   Yes,  yes    Safety Planning and Suicide Prevention discussed:  Yes,  yes  Trish Mage 03/03/2014, 3:06 PM

## 2014-03-03 NOTE — BHH Group Notes (Signed)
Adult Psychoeducational Group Note  Date:  03/03/2014 Time:  10:56 AM  Group Topic/Focus:  Morning Wellness group  Participation Level:  Minimal  Participation Quality:  Attentive  Affect:  Flat  Cognitive:  Alert  Insight: Improving  Engagement in Group:  Limited  Modes of Intervention:  Support  Additional Comments:   Alpha Gula 03/03/2014, 10:56 AM

## 2014-03-03 NOTE — Tx Team (Signed)
  Interdisciplinary Treatment Plan Update   Date Reviewed:  03/03/2014  Time Reviewed:  11:49 AM  Progress in Treatment:   Attending groups: Yes Participating in groups: Yes Taking medication as prescribed: Yes  Tolerating medication: Yes Family/Significant other contact made: Yes  Patient understands diagnosis: Yes  Discussing patient identified problems/goals with staff: Yes Medical problems stabilized or resolved: Yes Denies suicidal/homicidal ideation: Yes Patient has not harmed self or others: Yes  For review of initial/current patient goals, please see plan of care.  Estimated Length of Stay:  D/C tomorrow  Reason for Continuation of Hospitalization:   New Problems/Goals identified:  N/A  Discharge Plan or Barriers:   return home, follow up outpt  Additional Comments:  Attendees:  Signature: Corena Pilgrim, MD 03/03/2014 11:49 AM   Signature: Ripley Fraise, LCSW 03/03/2014 11:49 AM  Signature: Elmarie Shiley, NP 03/03/2014 11:49 AM  Signature: Mayra Neer, RN 03/03/2014 11:49 AM  Signature: Darrol Angel, RN 03/03/2014 11:49 AM  Signature:  03/03/2014 11:49 AM  Signature:   03/03/2014 11:49 AM  Signature:    Signature:    Signature:    Signature:    Signature:    Signature:      Scribe for Treatment Team:   Ripley Fraise, LCSW  03/03/2014 11:49 AM

## 2014-03-03 NOTE — Progress Notes (Signed)
Patient ID: Stacie Brown, female   DOB: 17-Mar-1976, 38 y.o.   MRN: [id number] [facility] MD Progress Note  03/03/2014 11:02 AM BITHA FAUTEUX  MRN:  [id number] Subjective:   Patient states "I have been doing so well since I got back on my medications. "   Objective:  Patient is seen and chart is reviewed. She reports she has been sleeping well and endorsed decreased anxiety and depressive symptoms. She rates her depression and anxiety at 3/10. Patient reports being less preoccupied with intrusive thoughts or being overwhelmed. Patient denies suicidal ideation, psychosis or delusions. She is compliant with her medications and has not endorsed any adverse reactions.   Diagnosis:   DSM5: Total Time spent with patient: 20 minutes Axis I: Bipolar 1 disorder, most recent episode mixed, severe, specified as with psychotic behavior  Axis II: Deferred Axis III:  Past Medical History  Diagnosis Date  . Herpes    Axis IV: economic problems, housing problems, occupational problems, other psychosocial or environmental problems, problems related to social environment and problems with primary support group Axis V: 50-60 moderate symptoms  ADL's:  Intact  Sleep: Fair  Appetite:  Fair  Suicidal Ideation:  Denies Homicidal Ideation:  Denies AEB (as evidenced by):  Psychiatric Specialty Exam: Physical Exam  Psychiatric: Her speech is normal and behavior is normal. Judgment and thought content normal. Her mood appears anxious. Cognition and memory are normal.    Review of Systems  Constitutional: Negative.   HENT: Negative.   Eyes: Negative.   Respiratory: Negative.   Cardiovascular: Negative.   Gastrointestinal: Negative.   Genitourinary: Negative.   Musculoskeletal: Negative.   Skin: Negative.   Neurological: Negative.   Endo/Heme/Allergies: Negative.   Psychiatric/Behavioral: Negative for suicidal ideas, hallucinations and memory loss. Substance abuse: UDS positive for marijuana  The  patient is nervous/anxious.     Blood pressure 110/74, pulse 111, temperature 98 F (36.7 C), temperature source Oral, resp. rate 18, height 5' 7.5" (1.715 m), weight 79.379 kg (175 lb), last menstrual period 02/25/2014.Body mass index is 26.99 kg/(m^2).  General Appearance: Fairly Groomed  Engineer, water::  Fair  Speech:  Clear and Coherent  Volume:  Normal  Mood:  Dysphoric  Affect:  Flat  Thought Process:  Goal Directed and Intact  Orientation:  Full (Time, Place, and Person)  Thought Content:  Paranoid Ideation and Rumination  Suicidal Thoughts:  No  Homicidal Thoughts:  No  Memory:  Immediate;   Good Recent;   Good Remote;   Good  Judgement:  Impaired  Insight:  Shallow  Psychomotor Activity:  Decreased  Concentration:  Fair  Recall:  [employer] of Knowledge:Fair  Language: Fair  Akathisia:  Negative  Handed:  Right  AIMS (if indicated):     Assets:  Communication Skills Desire for Improvement Housing  Sleep:  Number of Hours: 5.75   Musculoskeletal: Strength & Muscle Tone: within normal limits Gait & Station: normal Patient leans: N/A  Current Medications: Current Facility-Administered Medications  Medication Dose Route Frequency Provider Last Rate Last Dose  . acetaminophen (TYLENOL) tablet 650 mg  650 mg Oral Q6H PRN Lurena Nida, NP      . alum & mag hydroxide-simeth (MAALOX/MYLANTA) 200-200-20 MG/5ML suspension 30 mL  30 mL Oral Q4H PRN Lurena Nida, NP      . busPIRone (BUSPAR) tablet 10 mg  10 mg Oral TID Elmarie Shiley, NP   10 mg at 03/03/14 0806  . magnesium hydroxide (MILK OF MAGNESIA) suspension 30  mL  30 mL Oral Daily PRN Lurena Nida, NP      . perphenazine (TRILAFON) tablet 4 mg  4 mg Oral BID Yashar Inclan   4 mg at 03/03/14 0806  . [START ON 03/04/2014] sertraline (ZOLOFT) tablet 100 mg  100 mg Oral Daily Bhavik Cabiness        Lab Results:  No results found for this or any previous visit (from the past 48 hour(s)).  Physical Findings: AIMS:  ,  ,  ,  ,    CIWA:    COWS:     Treatment Plan Summary: Daily contact with patient to assess and evaluate symptoms and progress in treatment Medication management  Plan: 1. Continue crisis management and stabilization.  2. Medication management: - Increase  Zoloft to 100 mg daily for depression/obsessive thoughts. -Continue Trilafon to 4 mg BID for psychosis. -Continue  Buspar 10 mg TID for anxiety.  3. Encouraged patient to attend groups and participate in group counseling sessions and activities.  4. Discharge plan in progress. Anticipate d/c on 03/04/14 5. Continue current treatment plan.  6. Address health issues: Vitals reviewed and stable.   Medical Decision Making Problem Points:  Established problem, improving (1), Review of last therapy session (1) and Review of psycho-social stressors (1) Data Points:  Order Aims Assessment (2) Review or order clinical lab tests (1) Review of medication regiment & side effects (2) Review of new medications or change in dosage (2)  I certify that inpatient services furnished can reasonably be expected to improve the patient's condition.   Nusaiba Guallpa,MD 03/03/2014, 11:02 AM

## 2014-03-03 NOTE — Progress Notes (Signed)
Patient ID: Stacie Brown, female   DOB: [birth year], 38 y.o.   MRN: [id number] D: Patient stated she is doing well. Pt reports anxiety and worrying about where she will live after discharge. Pt reports wanting to save money and find her own place. Pt is observed in the dayroom playing cards with peers. Pt reports decrease anxiety and depressive symptoms. Pt denies SI/HI/AVH and pain. Pt attended evening wrap up group and engaged in discussion. Cooperative with assessment. No acute distressed noted at this time.   A: Met with pt 1:1. Medications administered as prescribed. Writer encouraged pt to discuss feelings. Pt encouraged to come to staff with any questions or concerns.   R: Patient is safe on the unit. She is complaint with medications and denies any adverse reaction. Continue current POC.

## 2014-03-03 NOTE — Progress Notes (Signed)
D: Patient denies SI/HI or AVH.  She is preparing for discharge tomorrow and expresses readiness.  She is somewhat anxious but otherwise appropriate.  She attended morning group but was minimally participating.     A: Patient given emotional support from RN. Patient encouraged to come to staff with concerns and/or questions. Patient's medication routine continued. Patient's orders and plan of care reviewed.   R: Patient remains appropriate and cooperative. Will continue to monitor patient q15 minutes for safety.

## 2014-03-04 MED ORDER — GUAIFENESIN ER 600 MG PO TB12
600.0000 mg | ORAL_TABLET | Freq: Two times a day (BID) | ORAL | Status: DC | PRN
  Administered 2014-03-04: 600 mg via ORAL
  Filled 2014-03-04: qty 1

## 2014-03-04 MED ORDER — BUSPIRONE HCL 10 MG PO TABS: 10.0000 mg | ORAL_TABLET | Freq: Three times a day (TID) | ORAL | Status: AC

## 2014-03-04 MED ORDER — PERPHENAZINE 4 MG PO TABS: 4.0000 mg | ORAL_TABLET | Freq: Two times a day (BID) | ORAL | Status: AC

## 2014-03-04 MED ORDER — SERTRALINE HCL 100 MG PO TABS: 100.0000 mg | ORAL_TABLET | Freq: Every day | ORAL | Status: AC

## 2014-03-04 NOTE — [facility] Suicide Risk Assessment (Signed)
   Demographic Factors:  Low socioeconomic status and Unemployed  Total Time spent with patient: 20 minutes  Psychiatric Specialty Exam: Physical Exam  Psychiatric: She has a normal mood and affect. Her speech is normal and behavior is normal. Judgment and thought content normal. Cognition and memory are normal.    Review of Systems  Constitutional: Negative.   HENT: Negative.   Eyes: Negative.   Respiratory: Negative.   Cardiovascular: Negative.   Gastrointestinal: Negative.   Genitourinary: Negative.   Musculoskeletal: Negative.   Skin: Negative.   Neurological: Negative.   Endo/Heme/Allergies: Negative.   Psychiatric/Behavioral: Negative.     Blood pressure 111/77, pulse 82, temperature 97.9 F (36.6 C), temperature source Oral, resp. rate 16, height 5' 7.5" (1.715 m), weight 79.379 kg (175 lb), last menstrual period 02/25/2014.Body mass index is 26.99 kg/(m^2).  General Appearance: Fairly Groomed  Engineer, water::  Good  Speech:  Clear and Coherent and Normal Rate  Volume:  Normal  Mood:  Euthymic  Affect:  Appropriate  Thought Process:  Goal Directed  Orientation:  Full (Time, Place, and Person)  Thought Content:  Negative  Suicidal Thoughts:  No  Homicidal Thoughts:  No  Memory:  Immediate;   Fair Recent;   Fair Remote;   Fair  Judgement:  Fair  Insight:  Fair  Psychomotor Activity:  Normal  Concentration:  Good  Recall:  Good  Fund of Knowledge:Good  Language: Good  Akathisia:  No  Handed:  Right  AIMS (if indicated):     Assets:  Communication Skills Desire for Improvement Physical Health  Sleep:  Number of Hours: 6    Musculoskeletal: Strength & Muscle Tone: within normal limits Gait & Station: normal Patient leans: N/A   Mental Status Per Nursing Assessment::   On Admission:  Suicidal ideation indicated by patient;Self-harm thoughts  Current Mental Status by Physician: patient denies suicidal ideation, intent or plan  Loss Factors: Financial  problems/change in socioeconomic status  Historical Factors: NA  Risk Reduction Factors:   Sense of responsibility to family, Living with another person, especially a relative and Positive social support  Continued Clinical Symptoms:  Resolving delusion, psychosis and mood symptoms  Cognitive Features That Contribute To Risk:  Closed-mindedness    Suicide Risk:  Minimal: No identifiable suicidal ideation.  Patients presenting with no risk factors but with morbid ruminations; may be classified as minimal risk based on the severity of the depressive symptoms  Discharge Diagnoses:   AXIS I:  Bipolar I disorder, most recent episode (or current) mixed, severe, specified as with psychotic behavior  AXIS II:  Deferred AXIS III:   Past Medical History  Diagnosis Date  . Herpes    AXIS IV:  other psychosocial or environmental problems and problems related to social environment AXIS V:  61-70 mild symptoms  Plan Of Care/Follow-up recommendations:  Activity:  as tolerated Diet:  healthy Tests:  routine Other:  patient to keep her after care appointment  Is patient on multiple antipsychotic therapies at discharge:  No   Has Patient had three or more failed trials of antipsychotic monotherapy by history:  No  Recommended Plan for Multiple Antipsychotic Therapies: NA    Corena Pilgrim, MD 03/04/2014, 9:28 AM

## 2014-03-04 NOTE — Progress Notes (Signed)
Patient ID: Stacie Brown, female   DOB: 12/22/1975, 38 y.o.   MRN: [id number] D: Patient in bed resting. Pt c/o of cough today but doing well. Pt reports looking forward to discharge tomorrow. Pt denies SI/HI/AVH and pain. Pt attended evening karaoke group and was supportive of peers. Cooperative with assessment. No acute distressed noted at this time.   A: Met with pt 1:1. Medications administered as prescribed. Writer encouraged pt to discuss feelings. Pt encouraged to come to staff with any questions or concerns.   R: Patient is safe on the unit. She is complaint with medications and denies any adverse reaction. Continue current POC.

## 2014-03-04 NOTE — Progress Notes (Signed)
Adult Psychoeducational Group Note  Date:  03/04/2014 Time:  6:41 PM  Group Topic/Focus:  Early Warning Signs:   The focus of this group is to help patients identify signs or symptoms they exhibit before slipping into an unhealthy state or crisis.  Participation Level:  Active  Participation Quality:  Appropriate, Attentive, Sharing and Supportive  Affect:  Appropriate  Cognitive:  Appropriate  Insight: Appropriate  Engagement in Group:  Engaged  Modes of Intervention:  Discussion  Additional Comments:    Clint Bolder 03/04/2014, 6:41 PM
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
## 2014-03-04 NOTE — Progress Notes (Signed)
Pt. Discharged per MD orders;  PT. Currently denies any HI/SI or AVH.  Pt. Was given education regarding follow up appointments and medications by RN.  Pt. Denies any questions or concerns about the medications.  Pt. Was escorted to the search room to retrieve her belongings by RN before being discharged to the hospital lobby.

## 2014-03-08 NOTE — Progress Notes (Signed)
Patient Discharge Instructions:  After Visit Summary (AVS):   Faxed to:  03/08/14 Discharge Summary Note:   Faxed to:  03/08/14 Psychiatric Admission Assessment Note:   Faxed to:  03/08/14 Suicide Risk Assessment - Discharge Assessment:   Faxed to:  03/08/14 Faxed/Sent to the Next Level Care provider:  03/08/14 Faxed to [facility] @ [phone] Faxed to [facility] @ [place], 03/08/2014, 4:03 PM

## 2014-04-05 ENCOUNTER — Telehealth: Payer: Self-pay | Admitting: *Deleted

## 2014-04-05 NOTE — Telephone Encounter (Signed)
Pharmacy requesting RF of Metronidazole

## 2014-05-02 ENCOUNTER — Ambulatory Visit: Payer: Medicaid Other | Admitting: Obstetrics

## 2014-06-13 ENCOUNTER — Other Ambulatory Visit: Payer: Self-pay | Admitting: Obstetrics

## 2014-06-13 ENCOUNTER — Ambulatory Visit (INDEPENDENT_AMBULATORY_CARE_PROVIDER_SITE_OTHER): Payer: Medicaid Other | Admitting: Obstetrics

## 2014-06-13 ENCOUNTER — Encounter: Payer: Self-pay | Admitting: Obstetrics

## 2014-06-13 VITALS — BP 107/73 | HR 80 | Temp 98.1°F | Ht 68.0 in | Wt 191.0 lb

## 2014-06-13 DIAGNOSIS — Z01419 Encounter for gynecological examination (general) (routine) without abnormal findings: Secondary | ICD-10-CM

## 2014-06-13 DIAGNOSIS — N949 Unspecified condition associated with female genital organs and menstrual cycle: Secondary | ICD-10-CM

## 2014-06-13 DIAGNOSIS — Z0000 Encounter for general adult medical examination without abnormal findings: Secondary | ICD-10-CM

## 2014-06-14 ENCOUNTER — Encounter: Payer: Self-pay | Admitting: Obstetrics

## 2014-06-14 DIAGNOSIS — N949 Unspecified condition associated with female genital organs and menstrual cycle: Secondary | ICD-10-CM | POA: Insufficient documentation

## 2014-06-14 LAB — GC/CHLAMYDIA PROBE AMP
CT PROBE, AMP APTIMA: NEGATIVE
GC Probe RNA: NEGATIVE

## 2014-06-14 LAB — PAP IG AND HPV HIGH-RISK: HPV DNA High Risk: NOT DETECTED

## 2014-06-14 LAB — WET PREP BY MOLECULAR PROBE
CANDIDA SPECIES: NEGATIVE
Gardnerella vaginalis: NEGATIVE
Trichomonas vaginosis: NEGATIVE

## 2014-06-14 NOTE — Progress Notes (Signed)
Subjective:     Stacie Brown is a 38 y.o. female here for a routine exam.  Current complaints: Chronic pelvic pain.    Personal health questionnaire:  Is patient Ashkenazi Jewish, have a family history of breast and/or ovarian cancer: no Is there a family history of uterine cancer diagnosed at age < 27, gastrointestinal cancer, urinary tract cancer, family member who is a Field seismologist syndrome-associated carrier: no Is the patient overweight and hypertensive, family history of diabetes, personal history of gestational diabetes or PCOS: no Is patient over 29, have PCOS,  family history of premature CHD under age 31, diabetes, smoke, have hypertension or peripheral artery disease:  no At any time, has a partner hit, kicked or otherwise hurt or frightened you?: no Over the past 2 weeks, have you felt down, depressed or hopeless?: no Over the past 2 weeks, have you felt little interest or pleasure in doing things?:no   Gynecologic History Patient's last menstrual period was 05/26/2014. Contraception: none Last Pap: 2014. Results were: normal Last mammogram: n/a. Results were: n/a  Obstetric History OB History  Gravida Para Term Preterm AB SAB TAB Ectopic Multiple Living  1    1 1         # Outcome Date GA Lbr Len/2nd Weight Sex Delivery Anes PTL Lv  1 SAB               Past Medical History  Diagnosis Date  . Herpes   . Depression     Past Surgical History  Procedure Laterality Date  . Laproscopic Bilateral     cyst on tubes removed  . Tubal ligation    . Tonsillectomy      Current outpatient prescriptions:acetaminophen (TYLENOL) 325 MG tablet, Take 650 mg by mouth every 6 (six) hours as needed for mild pain., Disp: , Rfl: ;  busPIRone (BUSPAR) 10 MG tablet, Take 1 tablet (10 mg total) by mouth 3 (three) times daily., Disp: 90 tablet, Rfl: 0;  perphenazine (TRILAFON) 4 MG tablet, Take 1 tablet (4 mg total) by mouth 2 (two) times daily., Disp: 60 tablet, Rfl: 0 sertraline (ZOLOFT) 100  MG tablet, Take 1 tablet (100 mg total) by mouth daily., Disp: 30 tablet, Rfl: 0 Allergies  Allergen Reactions  . Morphine And Related Itching    History  Substance Use Topics  . Smoking status: Current Every Day Smoker -- 0.25 packs/day    Types: Cigarettes  . Smokeless tobacco: Never Used  . Alcohol Use: No    Family History  Problem Relation Age of Onset  . Diabetes Mother   . Asthma Mother   . Cancer Mother   . Heart disease Father       Review of Systems  Constitutional: negative for fatigue and weight loss Respiratory: negative for cough and wheezing Cardiovascular: negative for chest pain, fatigue and palpitations Gastrointestinal: negative for abdominal pain and change in bowel habits Musculoskeletal:negative for myalgias Neurological: negative for gait problems and tremors Behavioral/Psych: negative for abusive relationship, depression Endocrine: negative for temperature intolerance   Genitourinary:negative for abnormal menstrual periods, genital lesions, hot flashes, sexual problems and vaginal discharge Integument/breast: negative for breast lump, breast tenderness, nipple discharge and skin lesion(s)    Objective:       BP 107/73  Pulse 80  Temp(Src) 98.1 F (36.7 C)  Ht 5' 8"  (1.727 m)  Wt 191 lb (86.637 kg)  BMI 29.05 kg/m2  LMP 05/26/2014 General:   alert  Skin:   no rash or abnormalities  Lungs:   clear to auscultation bilaterally  Heart:   regular rate and rhythm, S1, S2 normal, no murmur, click, rub or gallop  Breasts:   normal without suspicious masses, skin or nipple changes or axillary nodes  Abdomen:  normal findings: no organomegaly, soft, non-tender and no hernia  Pelvis:  External genitalia: normal general appearance Urinary system: urethral meatus normal and bladder without fullness, nontender Vaginal: normal without tenderness, induration or masses Cervix: normal appearance Adnexa: normal bimanual exam Uterus: anteverted and  non-tender, normal size   Lab Review Urine pregnancy test Labs reviewed yes Radiologic studies reviewed no    Assessment:    Healthy female exam.   Chronic pelvic pain.   Plan:    Education reviewed: safe sex/STD prevention. Follow up in: 2 weeks. Ultrasound ordered.   No orders of the defined types were placed in this encounter.   Orders Placed This Encounter  Procedures  . WET PREP BY MOLECULAR PROBE  . GC/Chlamydia Probe Amp  . US Transvaginal Non-OB    Plb/barbara                      Medicaid     [phone]    Standing Status: Future     Number of Occurrences:      Standing Expiration Date: 08/14/2015    Order Specific Question:  Reason for Exam (SYMPTOM  OR DIAGNOSIS REQUIRED)    Answer:  pelvic pain    Order Specific Question:  Preferred imaging location?    Answer[facility]  . US Pelvis Complete    Standing Status: Future     Number of Occurrences:      Standing Expiration Date: 08/14/2015    Order Specific Question:  Reason for Exam (SYMPTOM  OR DIAGNOSIS REQUIRED)    Answer:  pelvic pain    Order Specific Question:  Preferred imaging location?    Answer:  [facility]

## 2014-06-16 ENCOUNTER — Ambulatory Visit (HOSPITAL_COMMUNITY): Admission: RE | Admit: 2014-06-16 | Payer: Medicaid Other | Source: Ambulatory Visit

## 2014-06-16 ENCOUNTER — Ambulatory Visit (HOSPITAL_COMMUNITY)
Admission: RE | Admit: 2014-06-16 | Discharge: 2014-06-16 | Disposition: A | Discharge status: Home / Self Care | Payer: Medicaid Other | Source: Ambulatory Visit | Attending: Obstetrics | Admitting: Obstetrics

## 2014-06-16 DIAGNOSIS — N831 Corpus luteum cyst of ovary, unspecified side: Secondary | ICD-10-CM | POA: Insufficient documentation

## 2014-06-16 DIAGNOSIS — N949 Unspecified condition associated with female genital organs and menstrual cycle: Secondary | ICD-10-CM | POA: Diagnosis not present

## 2014-06-16 DIAGNOSIS — G8929 Other chronic pain: Secondary | ICD-10-CM | POA: Diagnosis not present

## 2014-06-16 DIAGNOSIS — N83209 Unspecified ovarian cyst, unspecified side: Secondary | ICD-10-CM | POA: Diagnosis not present

## 2014-06-29 ENCOUNTER — Ambulatory Visit: Payer: Medicaid Other | Admitting: Obstetrics

## 2014-07-06 ENCOUNTER — Telehealth: Payer: Self-pay

## 2014-07-06 ENCOUNTER — Ambulatory Visit (INDEPENDENT_AMBULATORY_CARE_PROVIDER_SITE_OTHER): Payer: Medicaid Other | Admitting: Obstetrics & Gynecology

## 2014-07-06 ENCOUNTER — Encounter: Payer: Self-pay | Admitting: Obstetrics

## 2014-07-06 VITALS — BP 120/78 | HR 77 | Temp 98.8°F | Ht 68.0 in | Wt 197.0 lb

## 2014-07-06 DIAGNOSIS — N83209 Unspecified ovarian cyst, unspecified side: Secondary | ICD-10-CM

## 2014-07-06 MED ORDER — NORETHINDRONE 0.35 MG PO TABS: 1.0000 | ORAL_TABLET | Freq: Every day | ORAL | Status: AC

## 2014-07-06 NOTE — Telephone Encounter (Signed)
CALLED PATIENT WITH U/S APPT TIME AT [facility] - 09/14/14

## 2014-07-10 NOTE — Patient Instructions (Signed)
Ovarian Cyst An ovarian cyst is a fluid-filled sac that forms on an ovary. The ovaries are small organs that produce eggs in women. Various types of cysts can form on the ovaries. Most are not cancerous. Many do not cause problems, and they often go away on their own. Some may cause symptoms and require treatment. Common types of ovarian cysts include:  Functional cysts--These cysts may occur every month during the menstrual cycle. This is normal. The cysts usually go away with the next menstrual cycle if the woman does not get pregnant. Usually, there are no symptoms with a functional cyst.  Endometrioma cysts--These cysts form from the tissue that lines the uterus. They are also called "chocolate cysts" because they become filled with blood that turns brown. This type of cyst can cause pain in the lower abdomen during intercourse and with your menstrual period.  Cystadenoma cysts--This type develops from the cells on the outside of the ovary. These cysts can get very big and cause lower abdomen pain and pain with intercourse. This type of cyst can twist on itself, cut off its blood supply, and cause severe pain. It can also easily rupture and cause a lot of pain.  Dermoid cysts--This type of cyst is sometimes found in both ovaries. These cysts may contain different kinds of body tissue, such as skin, teeth, hair, or cartilage. They usually do not cause symptoms unless they get very big.  Theca lutein cysts--These cysts occur when too much of a certain hormone (human chorionic gonadotropin) is produced and overstimulates the ovaries to produce an egg. This is most common after procedures used to assist with the conception of a baby (in vitro fertilization). CAUSES   Fertility drugs can cause a condition in which multiple large cysts are formed on the ovaries. This is called ovarian hyperstimulation syndrome.  A condition called polycystic ovary syndrome can cause hormonal imbalances that can lead to  nonfunctional ovarian cysts. SIGNS AND SYMPTOMS  Many ovarian cysts do not cause symptoms. If symptoms are present, they may include:  Pelvic pain or pressure.  Pain in the lower abdomen.  Pain during sexual intercourse.  Increasing girth (swelling) of the abdomen.  Abnormal menstrual periods.  Increasing pain with menstrual periods.  Stopping having menstrual periods without being pregnant. DIAGNOSIS  These cysts are commonly found during a routine or annual pelvic exam. Tests may be ordered to find out more about the cyst. These tests may include:  Ultrasound.  X-ray of the pelvis.  CT scan.  MRI.  Blood tests. TREATMENT  Many ovarian cysts go away on their own without treatment. Your health care provider may want to check your cyst regularly for 2-3 months to see if it changes. For women in menopause, it is particularly important to monitor a cyst closely because of the higher rate of ovarian cancer in menopausal women. When treatment is needed, it may include any of the following:  A procedure to drain the cyst (aspiration). This may be done using a long needle and ultrasound. It can also be done through a laparoscopic procedure. This involves using a thin, lighted tube with a tiny camera on the end (laparoscope) inserted through a small incision.  Surgery to remove the whole cyst. This may be done using laparoscopic surgery or an open surgery involving a larger incision in the lower abdomen.  Hormone treatment or birth control pills. These methods are sometimes used to help dissolve a cyst. HOME CARE INSTRUCTIONS   Only take over-the-counter  or prescription medicines as directed by your health care provider.  Follow up with your health care provider as directed.  Get regular pelvic exams and Pap tests. SEEK MEDICAL CARE IF:   Your periods are late, irregular, or painful, or they stop.  Your pelvic pain or abdominal pain does not go away.  Your abdomen becomes  larger or swollen.  You have pressure on your bladder or trouble emptying your bladder completely.  You have pain during sexual intercourse.  You have feelings of fullness, pressure, or discomfort in your stomach.  You lose weight for no apparent reason.  You feel generally ill.  You become constipated.  You lose your appetite.  You develop acne.  You have an increase in body and facial hair.  You are gaining weight, without changing your exercise and eating habits.  You think you are pregnant. SEEK IMMEDIATE MEDICAL CARE IF:   You have increasing abdominal pain.  You feel sick to your stomach (nauseous), and you throw up (vomit).  You develop a fever that comes on suddenly.  You have abdominal pain during a bowel movement.  Your menstrual periods become heavier than usual. MAKE SURE YOU:  Understand these instructions.  Will watch your condition.  Will get help right away if you are not doing well or get worse. Document Released: 10/28/2005 Document Revised: 11/02/2013 Document Reviewed: 07/05/2013 [facility] Patient Information 2015 [place], [place]. This information is not intended to replace advice given to you by your health care provider. Make sure you discuss any questions you have with your health care provider.

## 2014-07-10 NOTE — Progress Notes (Signed)
Patient ID: Stacie Brown, female   DOB: 12-26-1975, 38 y.o.   MRN: [id number]  Chief Complaint  Patient presents with  . Follow-up    Ultrasound    HPI Stacie Brown is a 38 y.o. female.  The recent U/S results showed a unilateral CLC.  HPI  Past Medical History  Diagnosis Date  . Herpes   . Depression     Past Surgical History  Procedure Laterality Date  . Laproscopic Bilateral     cyst on tubes removed  . Tubal ligation    . Tonsillectomy      Family History  Problem Relation Age of Onset  . Diabetes Mother   . Asthma Mother   . Cancer Mother   . Heart disease Father     Social History History  Substance Use Topics  . Smoking status: Current Every Day Smoker -- 0.25 packs/day    Types: Cigarettes  . Smokeless tobacco: Never Used  . Alcohol Use: No    Allergies  Allergen Reactions  . Morphine And Related Itching    Current Outpatient Prescriptions  Medication Sig Dispense Refill  . acetaminophen (TYLENOL) 325 MG tablet Take 650 mg by mouth every 6 (six) hours as needed for mild pain.      . busPIRone (BUSPAR) 10 MG tablet Take 1 tablet (10 mg total) by mouth 3 (three) times daily.  90 tablet  0  . perphenazine (TRILAFON) 4 MG tablet Take 1 tablet (4 mg total) by mouth 2 (two) times daily.  60 tablet  0  . sertraline (ZOLOFT) 100 MG tablet Take 1 tablet (100 mg total) by mouth daily.  30 tablet  0  . norethindrone (MICRONOR,CAMILA,ERRIN) 0.35 MG tablet Take 1 tablet (0.35 mg total) by mouth daily.  1 Package  11   No current facility-administered medications for this visit.    Review of Systems Review of Systems Constitutional: negative for fatigue and weight loss Respiratory: negative for cough and wheezing Cardiovascular: negative for chest pain, fatigue and palpitations Gastrointestinal: negative for abdominal pain and change in bowel habits Genitourinary: positive for pelvic pain Integument/breast: negative for nipple  discharge Musculoskeletal:negative for myalgias Neurological: negative for gait problems and tremors Behavioral/Psych: negative for abusive relationship, depression Endocrine: negative for temperature intolerance     Blood pressure 120/78, pulse 77, temperature 98.8 F (37.1 C), height 5' 8"  (1.727 m), weight 89.359 kg (197 lb), last menstrual period 05/26/2014.  Physical Exam Physical Exam   50% of 15 min visit spent on counseling and coordination of care.   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 US Transvaginal Non-OB    Standing Status: Future     Number of Occurrences:      Standing Expiration Date: 09/06/2015    Order Specific Question:  Reason for Exam (SYMPTOM  OR DIAGNOSIS REQUIRED)    Answer:  ovarian cyst follow up    Order Specific Question:  Preferred imaging location?    Answer:  [facility]  . US Pelvis Complete    Standing Status: Future     Number of Occurrences:      Standing Expiration Date: 09/06/2015    Order Specific Question:  Reason for Exam (SYMPTOM  OR DIAGNOSIS REQUIRED)    Answer:  ovarian cyst follow up    Order Specific Question:  Preferred imaging location?    Answer:  [facility]  Meds ordered this encounter  Medications  . norethindrone (MICRONOR,CAMILA,ERRIN) 0.35 MG tablet    Sig: Take 1 tablet (0.35 mg total) by mouth daily.    Dispense:  1 Package    Refill:  11    Follow up as needed.        JACKSON-MOORE,Icholas Irby A 07/10/2014, 9:11 PM

## 2014-09-12 ENCOUNTER — Encounter: Payer: Self-pay | Admitting: Obstetrics

## 2014-09-14 ENCOUNTER — Ambulatory Visit: Payer: Medicaid Other | Admitting: Obstetrics

## 2014-09-14 ENCOUNTER — Ambulatory Visit (HOSPITAL_COMMUNITY): Admission: RE | Admit: 2014-09-14 | Payer: Medicaid Other | Source: Ambulatory Visit

## 2014-11-07 ENCOUNTER — Encounter: Payer: Self-pay | Admitting: *Deleted

## 2014-11-08 ENCOUNTER — Encounter: Payer: Self-pay | Admitting: Obstetrics & Gynecology

## 2014-12-19 ENCOUNTER — Encounter (HOSPITAL_COMMUNITY): Payer: Self-pay | Admitting: *Deleted

## 2014-12-19 ENCOUNTER — Emergency Department (HOSPITAL_COMMUNITY): Payer: Medicaid Other

## 2014-12-19 ENCOUNTER — Emergency Department (HOSPITAL_COMMUNITY)
Admission: EM | Admit: 2014-12-19 | Discharge: 2014-12-19 | Disposition: A | Discharge status: Home / Self Care | Payer: Medicaid Other | Attending: Emergency Medicine | Admitting: Emergency Medicine

## 2014-12-19 DIAGNOSIS — N83201 Unspecified ovarian cyst, right side: Secondary | ICD-10-CM

## 2014-12-19 DIAGNOSIS — Z3202 Encounter for pregnancy test, result negative: Secondary | ICD-10-CM | POA: Insufficient documentation

## 2014-12-19 DIAGNOSIS — R1031 Right lower quadrant pain: Secondary | ICD-10-CM

## 2014-12-19 DIAGNOSIS — Z8619 Personal history of other infectious and parasitic diseases: Secondary | ICD-10-CM | POA: Diagnosis not present

## 2014-12-19 DIAGNOSIS — Z9851 Tubal ligation status: Secondary | ICD-10-CM | POA: Insufficient documentation

## 2014-12-19 DIAGNOSIS — N8320 Unspecified ovarian cysts: Secondary | ICD-10-CM | POA: Insufficient documentation

## 2014-12-19 DIAGNOSIS — Z720 Tobacco use: Secondary | ICD-10-CM | POA: Diagnosis not present

## 2014-12-19 DIAGNOSIS — F329 Major depressive disorder, single episode, unspecified: Secondary | ICD-10-CM | POA: Insufficient documentation

## 2014-12-19 DIAGNOSIS — Z79899 Other long term (current) drug therapy: Secondary | ICD-10-CM | POA: Diagnosis not present

## 2014-12-19 HISTORY — DX: Benign neoplasm of connective and other soft tissue, unspecified: D21.9

## 2014-12-19 LAB — CBC WITH DIFFERENTIAL/PLATELET
BASOS ABS: 0 10*3/uL (ref 0.0–0.1)
BASOS PCT: 0 % (ref 0–1)
EOS ABS: 0.1 10*3/uL (ref 0.0–0.7)
EOS PCT: 2 % (ref 0–5)
HCT: 40.2 % (ref 36.0–46.0)
Hemoglobin: 13.8 g/dL (ref 12.0–15.0)
Lymphocytes Relative: 30 % (ref 12–46)
Lymphs Abs: 1.8 10*3/uL (ref 0.7–4.0)
MCH: 29.3 pg (ref 26.0–34.0)
MCHC: 34.3 g/dL (ref 30.0–36.0)
MCV: 85.4 fL (ref 78.0–100.0)
Monocytes Absolute: 0.5 10*3/uL (ref 0.1–1.0)
Monocytes Relative: 8 % (ref 3–12)
Neutro Abs: 3.6 10*3/uL (ref 1.7–7.7)
Neutrophils Relative %: 60 % (ref 43–77)
PLATELETS: 188 10*3/uL (ref 150–400)
RBC: 4.71 MIL/uL (ref 3.87–5.11)
RDW: 12 % (ref 11.5–15.5)
WBC: 5.9 10*3/uL (ref 4.0–10.5)

## 2014-12-19 LAB — BASIC METABOLIC PANEL
ANION GAP: 7 (ref 5–15)
BUN: 11 mg/dL (ref 6–23)
CO2: 24 mmol/L (ref 19–32)
CREATININE: 0.69 mg/dL (ref 0.50–1.10)
Calcium: 8.9 mg/dL (ref 8.4–10.5)
Chloride: 106 mmol/L (ref 96–112)
GFR calc non Af Amer: >90 mL/min (ref >90)
Glucose, Bld: 99 mg/dL (ref 70–99)
Potassium: 4 mmol/L (ref 3.5–5.1)
SODIUM: 137 mmol/L (ref 135–145)

## 2014-12-19 LAB — URINALYSIS, ROUTINE W REFLEX MICROSCOPIC
BILIRUBIN URINE: NEGATIVE
GLUCOSE, UA: NEGATIVE mg/dL
KETONES UR: NEGATIVE mg/dL
Leukocytes, UA: NEGATIVE
Nitrite: NEGATIVE
PROTEIN: NEGATIVE mg/dL
SPECIFIC GRAVITY, URINE: 1.025 (ref 1.005–1.030)
Urobilinogen, UA: 1 mg/dL (ref 0.0–1.0)
pH: 7 (ref 5.0–8.0)

## 2014-12-19 LAB — URINE MICROSCOPIC-ADD ON

## 2014-12-19 MED ORDER — SODIUM CHLORIDE 0.9 % IV BOLUS (SEPSIS)
500.0000 mL | Freq: Once | INTRAVENOUS | Status: AC
  Administered 2014-12-19: 500 mL via INTRAVENOUS

## 2014-12-19 MED ORDER — IBUPROFEN 600 MG PO TABS
600.0000 mg | ORAL_TABLET | Freq: Four times a day (QID) | ORAL | Status: DC | PRN
Start: 2014-12-19 — End: 2014-12-22

## 2014-12-19 MED ORDER — KETOROLAC TROMETHAMINE 30 MG/ML IJ SOLN
30.0000 mg | Freq: Once | INTRAMUSCULAR | Status: AC
  Administered 2014-12-19: 30 mg via INTRAVENOUS
  Filled 2014-12-19: qty 1

## 2014-12-19 NOTE — ED Notes (Signed)
Dr Ray at bedside. 

## 2014-12-19 NOTE — ED Notes (Signed)
POCT Preg NEG

## 2014-12-19 NOTE — ED Notes (Signed)
Per pt and EMS report: pt presents with RLQ abd pain x 2 days.  Pt denies N/V/D.  Pt hx of fibroids on right ovary. Pt ambulatory.  A/o x 4. Skin warm and dry. Rates pain 7/10.

## 2014-12-19 NOTE — Discharge Instructions (Signed)
Please call your gyn office today for f/u this week of right ovarian cyst.

## 2014-12-19 NOTE — ED Notes (Signed)
Patient transported to Ultrasound 

## 2014-12-19 NOTE — ED Notes (Signed)
Bed: WA06 Expected date:  Expected time:  Means of arrival:  Comments: abd pain

## 2014-12-19 NOTE — ED Provider Notes (Signed)
CSN: [id number]     Arrival date & time 12/19/14  0729 History   First MD Initiated Contact with Patient 12/19/14 [phone]     Chief Complaint  Patient presents with  . Abdominal Pain     (Consider location/radiation/quality/duration/timing/severity/associated sxs/prior Treatment) HPI   39 year old female history of bipolar affective disorder presents today complaining of right lower quadrant abdominal pain that began yesterday with gradual onset. She describes it as pressure in nature. Last menstrual period was 6 days ago was a normal period at a normal time. She is not currently using any type of birth control. She is not taking any of her medications. She denies fever, chills, nausea, vomiting, diarrhea, urinary tract infection symptoms, or abnormal vaginal discharge. This pain is similar to pain she had previously with a ovarian cyst. She also states she has a history of fibroids. She states that she has had her tubes removed. She has seen Dr. Delsa Sale for similar pain in the past. She did not try any intervention for this pain. She states it awoke her during the night. She was going to take her car here but had a problem with her car and ended up calling EMS.  Past Medical History  Diagnosis Date  . Herpes   . Depression   . Fibroids    Past Surgical History  Procedure Laterality Date  . Laproscopic Bilateral     cyst on tubes removed  . Tubal ligation    . Tonsillectomy     Family History  Problem Relation Age of Onset  . Diabetes Mother   . Asthma Mother   . Cancer Mother   . Heart disease Father    History  Substance Use Topics  . Smoking status: Current Every Day Smoker -- 0.25 packs/day    Types: Cigarettes  . Smokeless tobacco: Never Used  . Alcohol Use: No   OB History    Gravida Para Term Preterm AB TAB SAB Ectopic Multiple Living   1    1  1         Review of Systems  All other systems reviewed and are negative.     Allergies  Morphine and  related  Home Medications   Prior to Admission medications   Medication Sig Start Date End Date Taking? Authorizing Provider  acetaminophen (TYLENOL) 325 MG tablet Take 650 mg by mouth every 6 (six) hours as needed for mild pain.    Historical Provider, MD  busPIRone (BUSPAR) 10 MG tablet Take 1 tablet (10 mg total) by mouth 3 (three) times daily. 03/04/14   Elmarie Shiley, NP  norethindrone (MICRONOR,CAMILA,ERRIN) 0.35 MG tablet Take 1 tablet (0.35 mg total) by mouth daily. 07/06/14   Lahoma Crocker, MD  perphenazine (TRILAFON) 4 MG tablet Take 1 tablet (4 mg total) by mouth 2 (two) times daily. 03/04/14   Elmarie Shiley, NP  sertraline (ZOLOFT) 100 MG tablet Take 1 tablet (100 mg total) by mouth daily. 03/04/14   Elmarie Shiley, NP   BP 117/73 mmHg  Pulse 58  Temp(Src) 97.7 F (36.5 C) (Oral)  Resp 14  SpO2 99%  LMP 12/13/2014 Physical Exam  Constitutional: She is oriented to person, place, and time. She appears well-developed and well-nourished.  HENT:  Head: Normocephalic and atraumatic.  Right Ear: External ear normal.  Left Ear: External ear normal.  Nose: Nose normal.  Mouth/Throat: Oropharynx is clear and moist.  Eyes: Conjunctivae and EOM are normal. Pupils are equal, round, and reactive to light.  Neck:  Normal range of motion. Neck supple.  Cardiovascular: Normal rate, regular rhythm, normal heart sounds and intact distal pulses.   Pulmonary/Chest: Effort normal and breath sounds normal.  Abdominal: Soft. Bowel sounds are normal. There is tenderness.    Musculoskeletal: Normal range of motion.  Neurological: She is alert and oriented to person, place, and time. She has normal reflexes.  Skin: Skin is warm and dry.  Psychiatric: She has a normal mood and affect. Her behavior is normal. Judgment and thought content normal.  Nursing note and vitals reviewed.   ED Course  Procedures (including critical care time) Labs Review Labs Reviewed  URINALYSIS, ROUTINE W REFLEX  MICROSCOPIC  PREGNANCY, URINE  CBC WITH DIFFERENTIAL/PLATELET  BASIC METABOLIC PANEL  POC URINE PREG, ED    Imaging Review US Transvaginal Non-ob  12/19/2014   CLINICAL DATA:  Right lower quadrant pain for 2 days.  EXAM: TRANSABDOMINAL AND TRANSVAGINAL ULTRASOUND OF PELVIS  TECHNIQUE: Both transabdominal and transvaginal ultrasound examinations of the pelvis were performed. Transabdominal technique was performed for global imaging of the pelvis including uterus, ovaries, adnexal regions, and pelvic cul-de-sac. It was necessary to proceed with endovaginal exam following the transabdominal exam to visualize the endometrium and ovaries.  COMPARISON:  Ultrasound of June 16, 2014.  FINDINGS: Uterus  Measurements: 7.0 x 3.8 x 3.5 cm. 1.2 cm exophytic abnormality arises from uterine fundus consistent with fibroid.  Endometrium  Thickness: 4.9 mm which is within normal limits. No focal abnormality visualized.  Right ovary  Not clearly delineated. 7.1 x 5.4 x 3.9 cm complex abnormality seen in the right adnexa. It is uncertain if this represents complex cystic neoplasm with significantly thickened septa or possibly hydrosalpinx.  Left ovary  Measurements: 3.4 x 2.2 x 1.8 cm. 2.1 cm simple cyst is noted.  Other findings  None.  IMPRESSION: Probable 1.2 cm exophytic fibroid arising from the uterine fundus.  7.1 cm complex abnormality seen in the right adnexal region concerning for possible complex cystic neoplasm with significantly thickened septa, or possibly large hydrosalpinx. Gynecological surgical consultation is recommended.   Electronically Signed   By: Sabino Dick M.D.   On: 12/19/2014 09:21   US Pelvis Complete  12/19/2014   CLINICAL DATA:  Right lower quadrant pain for 2 days.  EXAM: TRANSABDOMINAL AND TRANSVAGINAL ULTRASOUND OF PELVIS  TECHNIQUE: Both transabdominal and transvaginal ultrasound examinations of the pelvis were performed. Transabdominal technique was performed for global imaging of the  pelvis including uterus, ovaries, adnexal regions, and pelvic cul-de-sac. It was necessary to proceed with endovaginal exam following the transabdominal exam to visualize the endometrium and ovaries.  COMPARISON:  Ultrasound of June 16, 2014.  FINDINGS: Uterus  Measurements: 7.0 x 3.8 x 3.5 cm. 1.2 cm exophytic abnormality arises from uterine fundus consistent with fibroid.  Endometrium  Thickness: 4.9 mm which is within normal limits. No focal abnormality visualized.  Right ovary  Not clearly delineated. 7.1 x 5.4 x 3.9 cm complex abnormality seen in the right adnexa. It is uncertain if this represents complex cystic neoplasm with significantly thickened septa or possibly hydrosalpinx.  Left ovary  Measurements: 3.4 x 2.2 x 1.8 cm. 2.1 cm simple cyst is noted.  Other findings  None.  IMPRESSION: Probable 1.2 cm exophytic fibroid arising from the uterine fundus.  7.1 cm complex abnormality seen in the right adnexal region concerning for possible complex cystic neoplasm with significantly thickened septa, or possibly large hydrosalpinx. Gynecological surgical consultation is recommended.   Electronically Signed   By: Jeneen Rinks  Green M.D.   On: 12/19/2014 09:21     EKG Interpretation None     8:45 AM Patient in ultrasound  MDM   Final diagnoses:  RLQ abdominal pain    39 y.o. Female with rlq pain and right complex ovarian cyst.  This was previously noted on [place]. Plan nsaids and return to ob gyn for f/u asap.      Shaune Pollack, MD 12/20/14 [phone]

## 2014-12-22 ENCOUNTER — Encounter: Payer: Self-pay | Admitting: Obstetrics

## 2014-12-22 ENCOUNTER — Ambulatory Visit (INDEPENDENT_AMBULATORY_CARE_PROVIDER_SITE_OTHER): Payer: Medicaid Other | Admitting: Obstetrics

## 2014-12-22 VITALS — BP 118/74 | HR 82 | Temp 97.8°F | Ht 68.0 in | Wt 193.0 lb

## 2014-12-22 DIAGNOSIS — N83201 Unspecified ovarian cyst, right side: Secondary | ICD-10-CM

## 2014-12-22 DIAGNOSIS — N8320 Unspecified ovarian cysts: Secondary | ICD-10-CM

## 2014-12-22 MED ORDER — IBUPROFEN 600 MG PO TABS: 600.0000 mg | ORAL_TABLET | Freq: Four times a day (QID) | ORAL | Status: AC | PRN

## 2014-12-22 MED ORDER — IBUPROFEN 600 MG PO TABS
600.0000 mg | ORAL_TABLET | Freq: Four times a day (QID) | ORAL | Status: DC | PRN
Start: 2014-12-22 — End: 2014-12-22

## 2014-12-22 NOTE — Progress Notes (Signed)
Patient ID: Stacie Brown, female   DOB: Mar 17, 1976, 39 y.o.   MRN: [id number]  Chief Complaint  Patient presents with  . Problem    HPI Stacie Brown is a 39 y.o. female.  Has large right ovarian cyst on recent ultrasound.  HPI  Past Medical History  Diagnosis Date  . Herpes   . Depression   . Fibroids     Past Surgical History  Procedure Laterality Date  . Laproscopic Bilateral     cyst on tubes removed  . Tubal ligation    . Tonsillectomy      Family History  Problem Relation Age of Onset  . Diabetes Mother   . Asthma Mother   . Cancer Mother   . Heart disease Father     Social History History  Substance Use Topics  . Smoking status: Current Every Day Smoker -- 0.25 packs/day    Types: Cigarettes  . Smokeless tobacco: Never Used  . Alcohol Use: No    Allergies  Allergen Reactions  . Morphine And Related Itching    Current Outpatient Prescriptions  Medication Sig Dispense Refill  . acetaminophen (TYLENOL) 325 MG tablet Take 650 mg by mouth every 6 (six) hours as needed for mild pain.    . busPIRone (BUSPAR) 10 MG tablet Take 1 tablet (10 mg total) by mouth 3 (three) times daily. 90 tablet 0  . sertraline (ZOLOFT) 100 MG tablet Take 1 tablet (100 mg total) by mouth daily. 30 tablet 0  . ibuprofen (ADVIL,MOTRIN) 600 MG tablet Take 1 tablet (600 mg total) by mouth every 6 (six) hours as needed. 30 tablet 5  . Menthol, Topical Analgesic, 4 % GEL Apply 1 application topically as needed.    . norethindrone (MICRONOR,CAMILA,ERRIN) 0.35 MG tablet Take 1 tablet (0.35 mg total) by mouth daily. (Patient not taking: Reported on 12/19/2014) 1 Package 11  . perphenazine (TRILAFON) 4 MG tablet Take 1 tablet (4 mg total) by mouth 2 (two) times daily. (Patient not taking: Reported on 12/19/2014) 60 tablet 0   No current facility-administered medications for this visit.    Review of Systems Review of Systems Constitutional: negative for fatigue and weight loss Respiratory:  negative for cough and wheezing Cardiovascular: negative for chest pain, fatigue and palpitations Gastrointestinal: negative for abdominal pain and change in bowel habits Genitourinary: pelvic pain from right ovarian cyst Integument/breast: negative for nipple discharge Musculoskeletal:negative for myalgias Neurological: negative for gait problems and tremors Behavioral/Psych: negative for abusive relationship, depression Endocrine: negative for temperature intolerance     Blood pressure 118/74, pulse 82, temperature 97.8 F (36.6 C), height 5' 8"  (1.727 m), weight 193 lb (87.544 kg), last menstrual period 12/13/2014.  Physical Exam Physical Exam:  Deferred  100% of 10 min visit spent on counseling and coordination of care.   Data Reviewed Ultrasound  Assessment     Right ovarian cyst     Plan    Referred to Dr. Merrilee Jansky for F/U.   No orders of the defined types were placed in this encounter.   Meds ordered this encounter  Medications  . DISCONTD: ibuprofen (ADVIL,MOTRIN) 600 MG tablet    Sig: Take 1 tablet (600 mg total) by mouth every 6 (six) hours as needed.    Dispense:  30 tablet    Refill:  5  . ibuprofen (ADVIL,MOTRIN) 600 MG tablet    Sig: Take 1 tablet (600 mg total) by mouth every 6 (six) hours as needed.    Dispense:  30 tablet  Refill:  5

## 2015-02-20 ENCOUNTER — Telehealth: Payer: Self-pay

## 2015-02-20 NOTE — Telephone Encounter (Signed)
Patient's caseworker sent over medicaid transportation sheet to [place] to fill out for appt she has with Dr. Delsa Sale on 4/18 - gave her caseworker the fax number for [facility] and let patient know they needed to fill out

## 2015-05-18 ENCOUNTER — Encounter (HOSPITAL_COMMUNITY): Payer: Self-pay

## 2015-05-18 ENCOUNTER — Emergency Department (HOSPITAL_COMMUNITY): Payer: Medicaid Other

## 2015-05-18 ENCOUNTER — Emergency Department (HOSPITAL_COMMUNITY)
Admission: EM | Admit: 2015-05-18 | Discharge: 2015-05-18 | Disposition: A | Discharge status: Home / Self Care | Payer: Medicaid Other | Attending: Emergency Medicine | Admitting: Emergency Medicine

## 2015-05-18 DIAGNOSIS — Z79899 Other long term (current) drug therapy: Secondary | ICD-10-CM | POA: Insufficient documentation

## 2015-05-18 DIAGNOSIS — Z720 Tobacco use: Secondary | ICD-10-CM | POA: Insufficient documentation

## 2015-05-18 DIAGNOSIS — G8929 Other chronic pain: Secondary | ICD-10-CM | POA: Insufficient documentation

## 2015-05-18 DIAGNOSIS — Z8619 Personal history of other infectious and parasitic diseases: Secondary | ICD-10-CM | POA: Insufficient documentation

## 2015-05-18 DIAGNOSIS — R52 Pain, unspecified: Secondary | ICD-10-CM

## 2015-05-18 DIAGNOSIS — Z3202 Encounter for pregnancy test, result negative: Secondary | ICD-10-CM | POA: Insufficient documentation

## 2015-05-18 DIAGNOSIS — Z8742 Personal history of other diseases of the female genital tract: Secondary | ICD-10-CM | POA: Insufficient documentation

## 2015-05-18 DIAGNOSIS — F329 Major depressive disorder, single episode, unspecified: Secondary | ICD-10-CM | POA: Diagnosis not present

## 2015-05-18 DIAGNOSIS — B9689 Other specified bacterial agents as the cause of diseases classified elsewhere: Secondary | ICD-10-CM

## 2015-05-18 DIAGNOSIS — R102 Pelvic and perineal pain: Secondary | ICD-10-CM | POA: Insufficient documentation

## 2015-05-18 DIAGNOSIS — N760 Acute vaginitis: Secondary | ICD-10-CM | POA: Insufficient documentation

## 2015-05-18 HISTORY — DX: Other chronic pain: G89.29

## 2015-05-18 HISTORY — DX: Pelvic and perineal pain: R10.2

## 2015-05-18 HISTORY — DX: Unspecified ovarian cyst, unspecified side: N83.209

## 2015-05-18 LAB — URINALYSIS, ROUTINE W REFLEX MICROSCOPIC
BILIRUBIN URINE: NEGATIVE
Glucose, UA: NEGATIVE mg/dL
Ketones, ur: NEGATIVE mg/dL
Leukocytes, UA: NEGATIVE
NITRITE: NEGATIVE
PH: 5.5 (ref 5.0–8.0)
Protein, ur: NEGATIVE mg/dL
SPECIFIC GRAVITY, URINE: 1.024 (ref 1.005–1.030)
UROBILINOGEN UA: 0.2 mg/dL (ref 0.0–1.0)

## 2015-05-18 LAB — URINE MICROSCOPIC-ADD ON

## 2015-05-18 LAB — WET PREP, GENITAL
Trich, Wet Prep: NONE SEEN
Yeast Wet Prep HPF POC: NONE SEEN

## 2015-05-18 LAB — POC URINE PREG, ED: PREG TEST UR: NEGATIVE

## 2015-05-18 LAB — PREGNANCY, URINE: Preg Test, Ur: NEGATIVE

## 2015-05-18 MED ORDER — HYDROCODONE-ACETAMINOPHEN 5-325 MG PO TABS: ORAL_TABLET | ORAL | Status: AC

## 2015-05-18 MED ORDER — NAPROXEN 250 MG PO TABS: 250.0000 mg | ORAL_TABLET | Freq: Two times a day (BID) | ORAL | Status: AC | PRN

## 2015-05-18 MED ORDER — KETOROLAC TROMETHAMINE 60 MG/2ML IM SOLN
60.0000 mg | Freq: Once | INTRAMUSCULAR | Status: AC
  Administered 2015-05-18: 60 mg via INTRAMUSCULAR
  Filled 2015-05-18: qty 2

## 2015-05-18 MED ORDER — HYDROCODONE-ACETAMINOPHEN 5-325 MG PO TABS
1.0000 | ORAL_TABLET | Freq: Once | ORAL | Status: DC
  Filled 2015-05-18: qty 1

## 2015-05-18 MED ORDER — METRONIDAZOLE 500 MG PO TABS: 500.0000 mg | ORAL_TABLET | Freq: Two times a day (BID) | ORAL | Status: AC

## 2015-05-18 MED ORDER — ACETAMINOPHEN 500 MG PO TABS
1000.0000 mg | ORAL_TABLET | Freq: Once | ORAL | Status: AC
  Administered 2015-05-18: 1000 mg via ORAL
  Filled 2015-05-18: qty 2

## 2015-05-18 NOTE — ED Provider Notes (Signed)
CSN: [id number]     Arrival date & time 05/18/15  1219 History   First MD Initiated Contact with Patient 05/18/15 0740     Chief Complaint  Patient presents with  . Pelvic Pain      HPI Pt was seen at 0750.  Per pt, c/o gradual onset and persistence of waxing and waning acute flair of her chronic right pelvic "pain" for the past several years, worse since yesterday.  Describes the pelvic pain as per her usual chronic pain pattern "due to my ovarian cyst." States she is due "for an operation on my ovary" at [facility] next week for this pain.  Denies N/V, no diarrhea, no fevers, no back pain, no rash, no CP/SOB, no dysuria/hematuria, no vaginal bleeding/discharge.      Past Medical History  Diagnosis Date  . Herpes   . Depression   . Fibroids   . Chronic pelvic pain in female   . Ovarian cyst     right   Past Surgical History  Procedure Laterality Date  . Laproscopic Bilateral     cyst on tubes removed  . Tubal ligation    . Tonsillectomy     Family History  Problem Relation Age of Onset  . Diabetes Mother   . Asthma Mother   . Cancer Mother   . Heart disease Father    History  Substance Use Topics  . Smoking status: Current Every Day Smoker -- 0.25 packs/day    Types: Cigarettes  . Smokeless tobacco: Never Used  . Alcohol Use: No   OB History    Gravida Para Term Preterm AB TAB SAB Ectopic Multiple Living   1    1  1         Review of Systems ROS: Statement: All systems negative except as marked or noted in the HPI; Constitutional: Negative for fever and chills. ; ; Eyes: Negative for eye pain, redness and discharge. ; ; ENMT: Negative for ear pain, hoarseness, nasal congestion, sinus pressure and sore throat. ; ; Cardiovascular: Negative for chest pain, palpitations, diaphoresis, dyspnea and peripheral edema. ; ; Respiratory: Negative for cough, wheezing and stridor. ; ; Gastrointestinal: Negative for nausea, vomiting, diarrhea, abdominal pain, blood in stool, hematemesis,  jaundice and rectal bleeding. . ; ; Genitourinary: Negative for dysuria, flank pain and hematuria. ; ; GYN:  +pelvic pain. No vaginal bleeding, no vaginal discharge, no vulvar pain. ;; Musculoskeletal: Negative for back pain and neck pain. Negative for swelling and trauma.; ; Skin: Negative for pruritus, rash, abrasions, blisters, bruising and skin lesion.; ; Neuro: Negative for headache, lightheadedness and neck stiffness. Negative for weakness, altered level of consciousness , altered mental status, extremity weakness, paresthesias, involuntary movement, seizure and syncope.      Allergies  Morphine and related  Home Medications   Prior to Admission medications   Medication Sig Start Date End Date Taking? Authorizing Provider  acetaminophen (TYLENOL) 325 MG tablet Take 650 mg by mouth every 6 (six) hours as needed for mild pain.    Historical Provider, MD  busPIRone (BUSPAR) 10 MG tablet Take 1 tablet (10 mg total) by mouth 3 (three) times daily. 03/04/14   Niel Hummer, NP  ibuprofen (ADVIL,MOTRIN) 600 MG tablet Take 1 tablet (600 mg total) by mouth every 6 (six) hours as needed. 12/22/14   Shelly Bombard, MD  Menthol, Topical Analgesic, 4 % GEL Apply 1 application topically as needed.    Historical Provider, MD  norethindrone (MICRONOR,CAMILA,ERRIN) 0.35 MG  tablet Take 1 tablet (0.35 mg total) by mouth daily. Patient not taking: Reported on 12/19/2014 07/06/14   Lahoma Crocker, MD  perphenazine (TRILAFON) 4 MG tablet Take 1 tablet (4 mg total) by mouth 2 (two) times daily. Patient not taking: Reported on 12/19/2014 03/04/14   Niel Hummer, NP  sertraline (ZOLOFT) 100 MG tablet Take 1 tablet (100 mg total) by mouth daily. 03/04/14   Niel Hummer, NP   BP 129/83 mmHg  Pulse 65  Temp(Src) 97.6 F (36.4 C) (Oral)  Resp 16  SpO2 100%  LMP 05/12/2015 (Approximate) Physical Exam 0755: Physical examination:  Nursing notes reviewed; Vital signs and O2 SAT reviewed;  Constitutional: Well  developed, Well nourished, Well hydrated, In no acute distress; Head:  Normocephalic, atraumatic; Eyes: EOMI, PERRL, No scleral icterus; ENMT: Mouth and pharynx normal, Mucous membranes moist; Neck: Supple, Full range of motion, No lymphadenopathy; Cardiovascular: Regular rate and rhythm, No murmur, rub, or gallop; Respiratory: Breath sounds clear & equal bilaterally, No rales, rhonchi, wheezes.  Speaking full sentences with ease, Normal respiratory effort/excursion; Chest: Nontender, Movement normal; Abdomen: Soft, +minimal right lower pelvic tenderness to palp. No rebound or guarding. Nondistended, Normal bowel sounds; Genitourinary: No CVA tenderness. Pelvic exam performed with permission of pt and female ED tech assist during exam.  External genitalia w/o lesions. Vaginal vault without discharge.  Cervix w/o lesions, not friable, GC/chlam and wet prep obtained and sent to lab.  Bimanual exam w/o CMT, uterine or adnexal tenderness.; Extremities: Pulses normal, No tenderness, No edema, No calf edema or asymmetry.; Neuro: AA&Ox3, Major CN grossly intact.  Speech clear. No gross focal motor or sensory deficits in extremities. Climbs on and off stretcher easily by herself. Gait steady.; Skin: Color normal, Warm, Dry.   ED Course  Procedures     EKG Interpretation None      MDM  MDM Reviewed: previous chart, nursing note and vitals Interpretation: labs and ultrasound     Results for orders placed or performed during the hospital encounter of 05/18/15  Wet prep, genital  Result Value Ref Range   Yeast Wet Prep HPF POC NONE SEEN NONE SEEN   Trich, Wet Prep NONE SEEN NONE SEEN   Clue Cells Wet Prep HPF POC MANY (A) NONE SEEN   WBC, Wet Prep HPF POC FEW (A) NONE SEEN  Urinalysis, Routine w reflex microscopic (not at [facility])  Result Value Ref Range   Color, Urine YELLOW YELLOW   APPearance CLOUDY (A) CLEAR   Specific Gravity, Urine 1.024 1.005 - 1.030   pH 5.5 5.0 - 8.0   Glucose, UA NEGATIVE  NEGATIVE mg/dL   Hgb urine dipstick MODERATE (A) NEGATIVE   Bilirubin Urine NEGATIVE NEGATIVE   Ketones, ur NEGATIVE NEGATIVE mg/dL   Protein, ur NEGATIVE NEGATIVE mg/dL   Urobilinogen, UA 0.2 0.0 - 1.0 mg/dL   Nitrite NEGATIVE NEGATIVE   Leukocytes, UA NEGATIVE NEGATIVE  Pregnancy, urine  Result Value Ref Range   Preg Test, Ur NEGATIVE NEGATIVE  Urine microscopic-add on  Result Value Ref Range   Squamous Epithelial / LPF RARE RARE   WBC, UA 0-2 <3 WBC/hpf   RBC / HPF 3-6 <3 RBC/hpf   Bacteria, UA MANY (A) RARE  POC Urine Pregnancy, ED  (If Pre-menopausal female)  not at [facility]  Result Value Ref Range   Preg Test, Ur NEGATIVE NEGATIVE    [place] Art/ven Flow Abd Pelv Doppler 05/18/2015   CLINICAL DATA:  Ovarian cyst, right pelvic pain. Prior laparoscopic  surgery with removal of 2 of the lesions described as cysts.  EXAM: TRANSABDOMINAL AND TRANSVAGINAL ULTRASOUND OF PELVIS  DOPPLER ULTRASOUND OF OVARIES  TECHNIQUE: Both transabdominal and transvaginal ultrasound examinations of the pelvis were performed. Transabdominal technique was performed for global imaging of the pelvis including uterus, ovaries, adnexal regions, and pelvic cul-de-sac.  It was necessary to proceed with endovaginal exam following the transabdominal exam to visualize the uterus and endometrium. Color and duplex Doppler ultrasound was utilized to evaluate blood flow to the ovaries.  COMPARISON:  12/19/2014  FINDINGS: Uterus  Measurements: 7.1 by 3.5 by 4.9 cm. There is a 1.1 by 1.4 by 0.9 cm posterior fundal mass lesion favoring fibroid.  Endometrium  Thickness: 3 mm.  No focal abnormality visualized.  Right ovary  Measurements: 2.6 by 1.5 by 1.5 cm. This does not include the 5.1 by 2.1 by 4.8 cm bilobed cyst along the margin of the ovary, which may well have a thickened wall at some points up to 4 mm and which has mild marginal irregularity.  Left ovary  Measurements: 3.6 by 2.1 by 2.9 cm. This measurement does include the 1.8 by 1.3  by 1.7 cm peripheral cyst or follicle of the left ovary, which has a simple appearance.  Pulsed Doppler evaluation of both ovaries demonstrates normal low-resistance arterial and venous waveforms. The  Other findings  No free fluid.  IMPRESSION: 1. No ovarian torsion identified. 2. Large bilobed cystic lesion along the right adnexa, with some wall thickening and mild marginal irregularity. This could represent a thickened/inflamed fallopian tube, a complex cyst, or conceivably a cystic neoplasm. By report the patient has had prior laparoscopic surgery with removal of cystic lesions, and correlation to the findings at the time of prior surgery is suggested. Were prior lesions endometriomas? If this lesion merits further imaging workup based on the clinical scenario, ovarian protocol MRI with and without contrast would be the most specific imaging method for further workup. 3. Fairly simple appearing 1.8 cm peripheral cyst or follicle of the left ovary. 4. 1.4 cm uterine fundal fibroid.   Electronically Signed   By: Van Clines M.D.   On: 05/18/2015 09:29    0940:  Pt continues to appear very comfortable on stretcher, talking on telephone, resps easy. Pelvic exam with minimal tenderness on exam. VS remain stable. No clear UTI on Udip and pt denies dysuria; UC is pending. Will tx for BV while GC/chlam pending. Pt requesting "a rx for some pain medicines." Aware she will receive a limited supply; verb understanding. Long hx of chronic pelvic pain.  Pt endorses acute flair of her usual long standing chronic pain today, no change from her usual chronic pain pattern.  Pt encouraged to f/u with her OB/GYN MD for good continuity of care and control of her chronic pain.  Verb understanding.    Francine Graven, DO 05/21/15 1105

## 2015-05-18 NOTE — ED Notes (Signed)
Awake. Verbally responsive. A/O x4. Resp even and unlabored. No audible adventitious breath sounds noted. ABC's intact.  

## 2015-05-18 NOTE — ED Notes (Signed)
Pt presents with c/o abdominal pain in her right lower quadrant from an ovarian cyst. Pt reports the pain in that area started yesterday. Pt reports she is having an operation on that area next Friday but the pain is unbearable at this point and not relieved with the motrin she has been taking. Pt denies any N/V/D.

## 2015-05-18 NOTE — Discharge Instructions (Signed)
°Emergency Department Resource Guide °1) Find a Doctor and Pay Out of Pocket °Although you won't have to find out who is covered by your insurance plan, it is a good idea to ask around and get recommendations. You will then need to call the office and see if the doctor you have chosen will accept you as a new patient and what types of options they offer for patients who are self-pay. Some doctors offer discounts or will set up payment plans for their patients who do not have insurance, but you will need to ask so you aren't surprised when you get to your appointment. ° °2) Contact Your Local Health Department °Not all health departments have doctors that can see patients for sick visits, but many do, so it is worth a call to see if yours does. If you don't know where your local health department is, you can check in your phone book. The CDC also has a tool to help you locate your state's health department, and many state websites also have listings of all of their local health departments. ° °3) Find a Walk-in Clinic °If your illness is not likely to be very severe or complicated, you may want to try a walk in clinic. These are popping up all over the country in pharmacies, drugstores, and shopping centers. They're usually staffed by nurse practitioners or physician assistants that have been trained to treat common illnesses and complaints. They're usually fairly quick and inexpensive. However, if you have serious medical issues or chronic medical problems, these are probably not your best option. ° °No Primary Care Doctor: °- Call [facility] at  832-8000 - they can help you locate a primary care doctor that  accepts your insurance, provides certain services, etc. °- Physician Referral Service- 1-800-533-3463 ° °Chronic Pain Problems: °Organization         Address  Phone   Notes  °[facility]  (336) 297-2271 Patients need to be referred by their primary care doctor.  ° °Medication  Assistance: °Organization         Address  Phone   Notes  °[place] [facility] [address] °[place], [place] [address] (336) 641-8030 --Must be a resident of [place] °-- Must have NO insurance coverage whatsoever (no Medicaid/ Medicare, etc.) °-- The pt. MUST have a primary care doctor that directs their care regularly and follows them in the community °  °[facility]  (866) 331-1348   °[employer]  (888) 892-1162   ° °Agencies that provide inexpensive medical care: °Organization         Address  Phone   Notes  °[facility]  (336) 832-8035   °[facility]    (336) 832-7272   °[facility] [address] °[place], [place] [address] (336) 832-4777   °[facility] [address], °[place] (336) 271-4999   °[facility]    (336) 373-0678   °[facility]    (336) 272-1050   °[facility] ° [address], [place] Phone:  (336) 832-4444, Fax:  (336) 832-4440 Hours of Operation:  9 am - 6 pm, M-F.  Also accepts Medicaid/Medicare and self-pay.  °[facility] ° [address], [place] Phone: (336) 832-3150, Fax: (336) [phone]. Hours of Operation:  8:30 am - 5:30 pm, M-F.  Also accepts Medicaid and self-pay.  °[facility] [place] [address], [place] Phone: (336) 878-6027   °[facility] [address], [place], [place] (336)723-1848, Ext. 123 Mondays & Thursdays: 7-9 AM.  First 15 patients are seen on a first come, first serve basis. °  ° °Medicaid-accepting [place] Providers: ° °Organization         Address  Phone   Notes  °[facility] [address], [place] (336) 641-2100 Also accepts self-pay patients.  °[facility] [address], [place] ° (336) 856-9996   °[facility] [address], [place]  (336) 288-8857   °[facility] [address] [place] [address], [place] (336) 299-7000   °Veita Bland [address], [place]  ° (336) 373-1557 Only accepts [place] Access Medicaid patients after they have their name applied to their card.  ° °Self-Pay (no insurance) in [place]: ° °Organization         Address  Phone   Notes  °Sickle Cell Patients, [facility] [address], [place] (336) 832-1970   °[facility] [address], [place] (336) 832-4400   °[facility] ° [address], [place] (336) 992-4800   °[facility]/Dr. Osei-Bonsu ° [address] [place] [address], [place] or [address], [place] (336) [phone] Phone number for both [place] and [place] locations is the same.  °[facility] [address], [place] (336) 299-0000   °[facility] [address] [place] [address], [place] or [address] (336) 852-7530 °(336) 878-2260   °[facility] [address], [place] (336) 350-1642, phone; (336) 294-5005, fax Sees patients 1st and 3rd Saturday of every month.  Must not qualify for public or private insurance (i.e. Medicaid, Medicare, [place] Health Choice, Veterans' Benefits) • Household income should be no more than 200% of the poverty level •The clinic cannot treat you if you are pregnant or think you are pregnant • Sexually transmitted diseases are not treated at the clinic.  ° ° °Dental Care: °Organization         Address  Phone  Notes  °[place] [facility] [address], [place] (336) 641-6152 Accepts children up to age 21 who are enrolled in Medicaid or [place] Health Choice; pregnant women with a Medicaid card; and children who have applied for Medicaid or [place] Health Choice, but were declined, whose parents can pay a reduced fee at time of service.  °[place]  [facility] [place]  [address], [place] (336) 641-7733 Accepts children up to age 21 who are enrolled in Medicaid or [place] Health Choice; pregnant women with a Medicaid card; and children who have applied for Medicaid or  Health Choice, but were declined, whose parents can pay a reduced fee at time of service.  °[facility] ° [address],  (336) 641-4533 Patients are seen by appointment only. Walk-ins are not accepted. [facility] will see patients 18 years of age and older. °Monday - Tuesday (8am-5pm) °Most Wednesdays (8:30-5pm) °$30 per visit, cash only  °[facility] ° [address], [place] (336) 641-4533 Patients are seen by appointment only. Walk-ins are not accepted. [facility] will see patients 18 years of age and older. °One   Wednesday Evening (Monthly: Volunteer Based).  $30 per visit, cash only  °[facility]  (919) [phone] for adults; Children under age 4, call Graduate Pediatric Dentistry at ([phone]) [phone]. Children aged 4-14, please call ([phone]) [phone] to request a pediatric application. ° Dental services are provided in all areas of dental care including fillings, crowns and bridges, complete and partial dentures, implants, gum treatment, root canals, and extractions. Preventive care is also provided. Treatment is provided to both adults and children. °Patients are selected via a lottery and there is often a waiting list. °  °[facility] [address], °[place] ° (336) 763-8833 [email or web address] °  °[facility] [address], [place], [place] (336)723-1848, Ext. 123 Second and Fourth Thursday of each month, opens at 6:30 AM; Clinic ends at 9 AM.  Patients are seen on a first-come first-served basis, and a limited number are seen during each clinic.  ° °[facility] ° [address], [place], [place] (336) 723-7904    Eligibility Requirements °You must have lived in [place], [place], or [place] counties for at least the last three months. °  You cannot be eligible for state or federal sponsored healthcare insurance, including Veterans Administration, Medicaid, or Medicare. °  You generally cannot be eligible for healthcare insurance through your employer.  °  How to apply: °Eligibility screenings are held every Tuesday and Wednesday afternoon from 1:00 pm until 4:00 pm. You do not need an appointment for the interview!  °[facility] [address], [place], [place] 336-631-2330   °[facility]  336-342-8273   °[place]  336-703-3100   ° [facility]  336-570-6415   ° °Behavioral Health Resources in the Community: °Intensive Outpatient Programs °Organization         Address  Phone  Notes  °[facility] [address], [place], [place] 336-878-6098   °[facility] [address], [place], [place] 336-832-9800   °[facility] [address], [place], [place] ° 336-882-2125   °[facility] [address],  °[place], [place] 1-800-853-5163 or 336-641-4981   °Substance Abuse Resources °Organization         Address  Phone  Notes  °[facility]  336-882-2125   °[facility]  336-784-9470   °[facility]  336-285-9073   °[facility]  336-845-3988   °Residential & Outpatient Substance Abuse Program  1-800-659-3381   °Psychological Services °Organization         Address  Phone  Notes  °[facility]  336- 832-9600   °[facility]  336- 378-7881   °[facility] [address], [place] 1-800-853-5163 or 336-641-4981   ° °Mobile Crisis Teams °Organization         Address  Phone  Notes  °[facility], Mobile Crisis Care Unit  1-877-626-1772   °Assertive °[facility] ° [address]  [place], [place] 336-834-9664   °Sharon DeEsch [address] °[place] 336-554-5454   ° °Self-Help/Support Groups °Organization         Address  Phone             Notes  °[facility]  - variety of support groups  336- 373-1402 Call for more information  °Narcotics Anonymous (NA), [facility] [address], °[place]  2 meetings at this location  ° °  Residential Treatment Programs Organization         Address  Phone  Notes  [facility] [address]    [place]  1-(458)846-1388   [facility]  [address] [place] [address], [place], [place]   [place], [place] [phone] Admissions: 8am-3pm M-F  [facility] [address]    [place], [place] 939-030-0923   [facility] [address] [place], [place], [place]   [facility] [address]  [place], [place]   [facility] Intensive Outpatient [address], [place], [place]   [facility] [address]  [place], [place] 1-918-362-2584 or [phone]   [facility] [address] [place], [place] Accepts Medicaid  [facility] [address],  [place] 1-217 547 7842 Substance Abuse/Addiction Treatment   [facility]         Address  Phone  Notes  [facility]  5626275119   Domenic Schwab, PhD [address] [place] [address] Arlis [place]a [place], [place]   9027573493 or (707)868-8658   [place], [place] 631-390-9621   [facility] [address] [place], [place] (229)008-6697 Insurance/Medicaid/sponsorship through [facility] [address] [place]                                    [place], [place] [phone], [place] (970)[phone]    Dr. Adele Schilder  845-676-3937   [facility] Dept. 1) [address] [place] 2) [place] 3)  [address], [place] ([phone]) [phone]  (503)336-2007   [place] [phone] or ([phone] (After Hours)      Take the prescriptions as directed.  Apply moist heat to the area(s) of discomfort, for 15 minutes at a time, several times per day for the next few days.  Do not fall asleep on a heating pack.  Call your regular OB/GYN doctor today to schedule a follow up appointment in the next 2 days.  Return to the Emergency Department immediately if worsening.

## 2015-05-19 LAB — URINE CULTURE: Culture: NO GROWTH

## 2015-05-19 LAB — GC/CHLAMYDIA PROBE AMP (~~LOC~~) NOT AT ARMC
CHLAMYDIA, DNA PROBE: NEGATIVE
NEISSERIA GONORRHEA: NEGATIVE

## 2015-10-24 IMAGING — US US TRANSVAGINAL NON-OB
1 series · 13 of 25 positions shown · non-contrast
Comparison: 12/19/2014

CLINICAL DATA: Ovarian cyst, right pelvic pain. Prior laparoscopic
surgery with removal of 2 of the lesions described as cysts.

EXAM:
TRANSABDOMINAL AND TRANSVAGINAL ULTRASOUND OF PELVIS
DOPPLER ULTRASOUND OF OVARIES
TECHNIQUE: Both transabdominal and transvaginal ultrasound examinations of the
pelvis were performed. Transabdominal technique was performed for
global imaging of the pelvis including uterus, ovaries, adnexal
regions, and pelvic cul-de-sac.
It was necessary to proceed with endovaginal exam following the
transabdominal exam to visualize the uterus and endometrium. Color
and duplex Doppler ultrasound was utilized to evaluate blood flow to
the ovaries.

[Series 1: us transvaginal non-ob · 0.21mm/px · 13 of 107 slices shown]
[im 1/107]
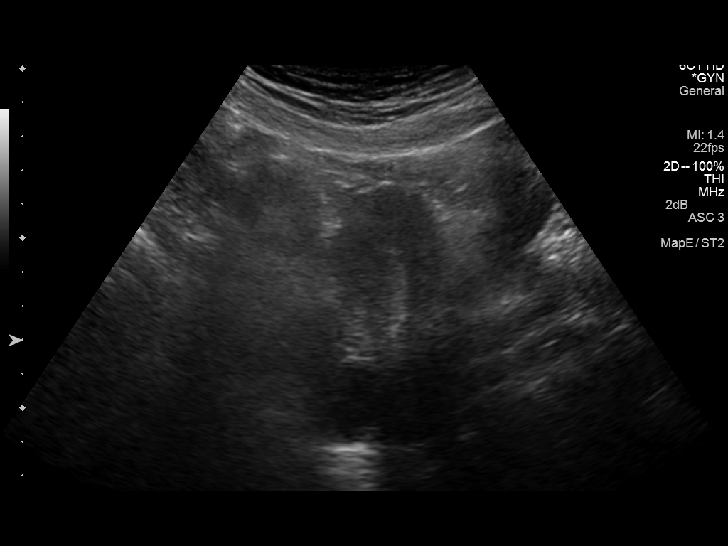
[im 9/107]
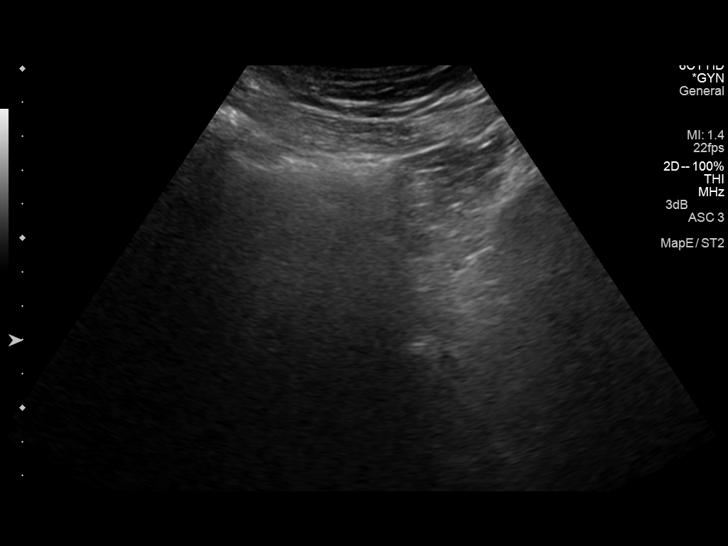
[im 18/107]
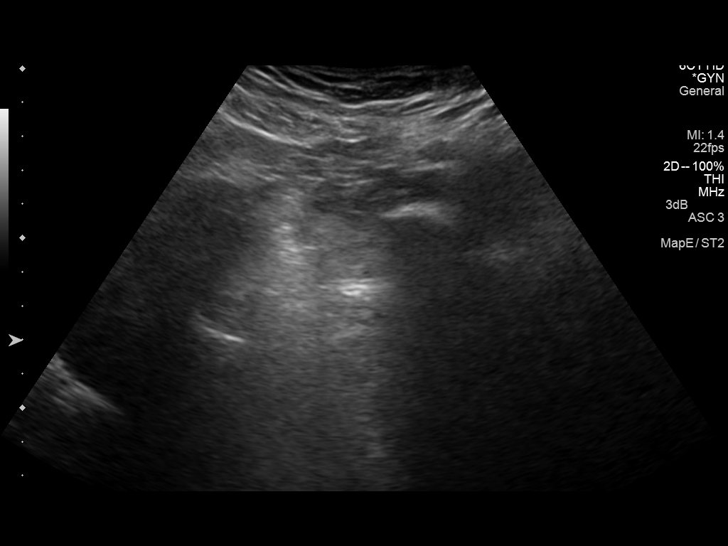
[im 27/107]
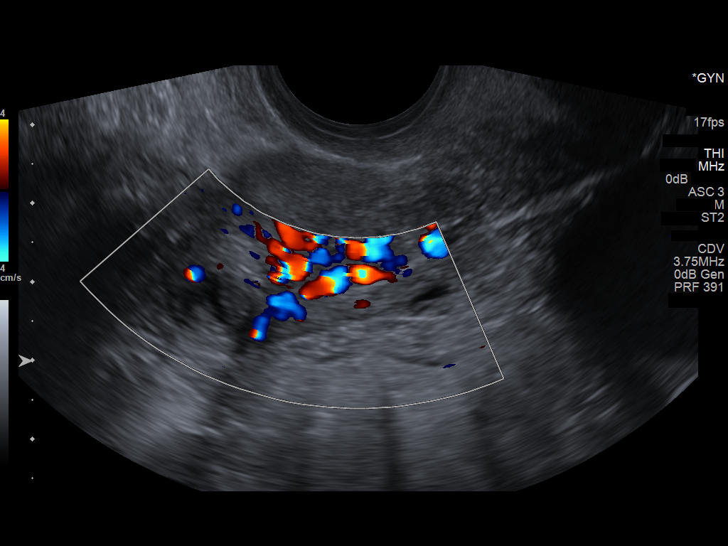
[im 36/107]
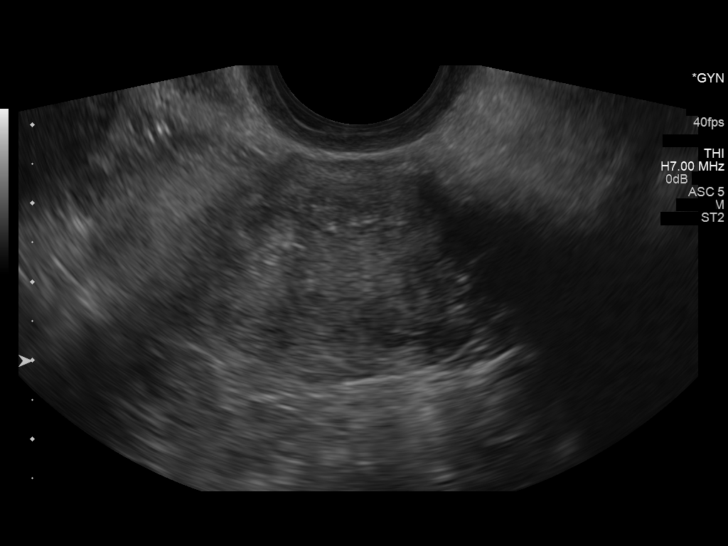
[im 45/107]
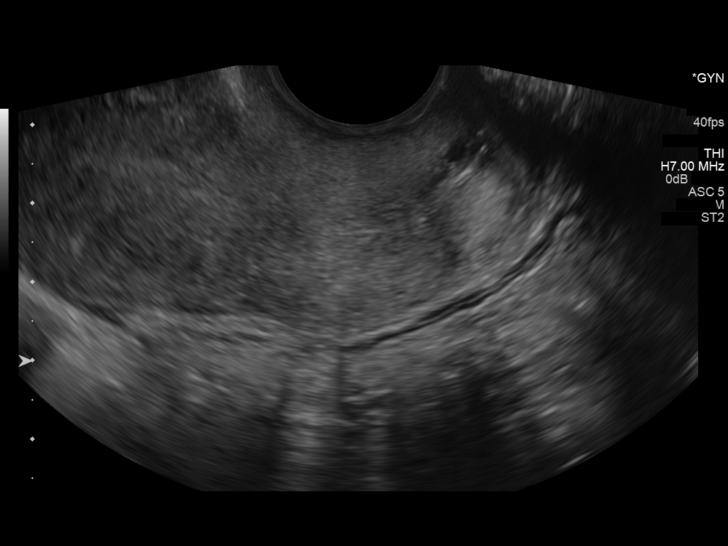
[im 54/107]
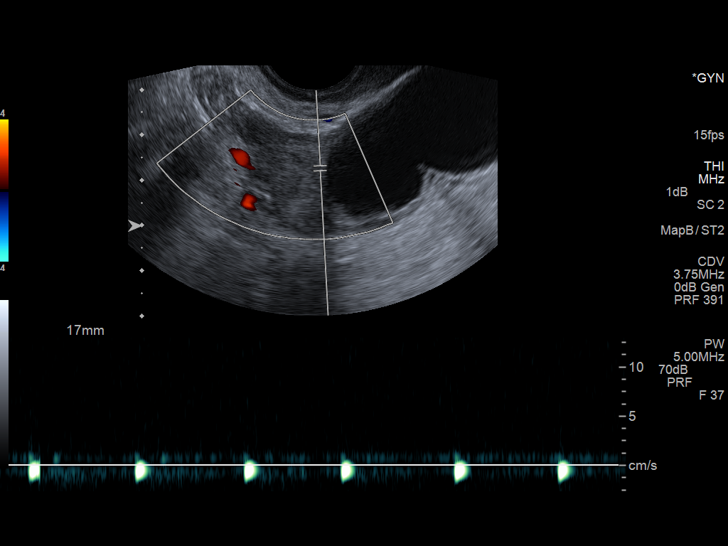
[im 62/107]
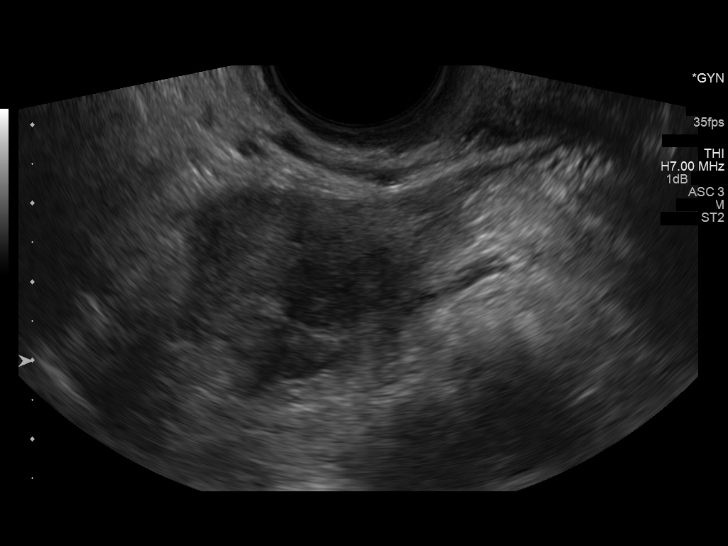
[im 71/107]
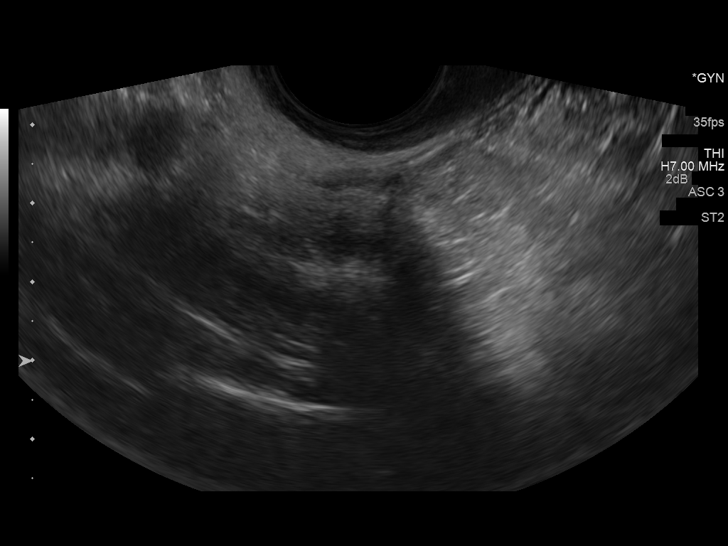
[im 80/107]
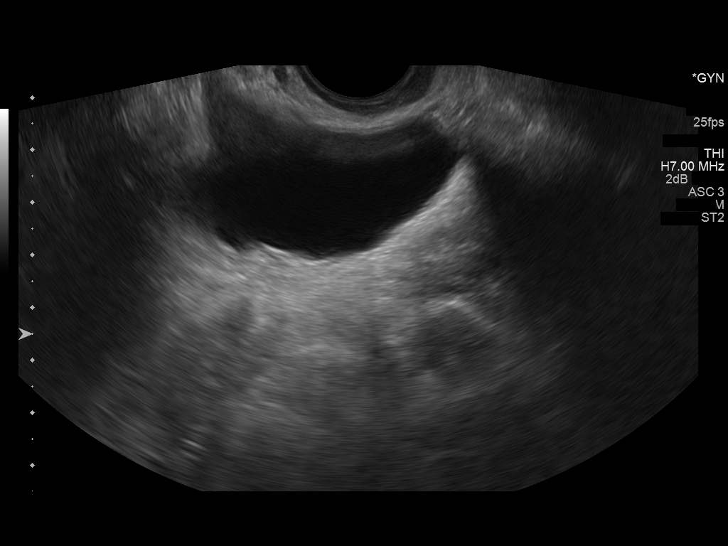
[im 89/107]
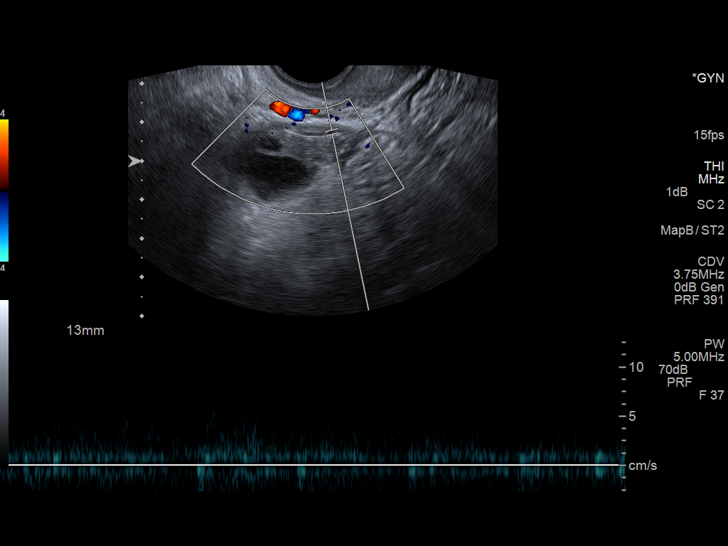
[im 98/107]
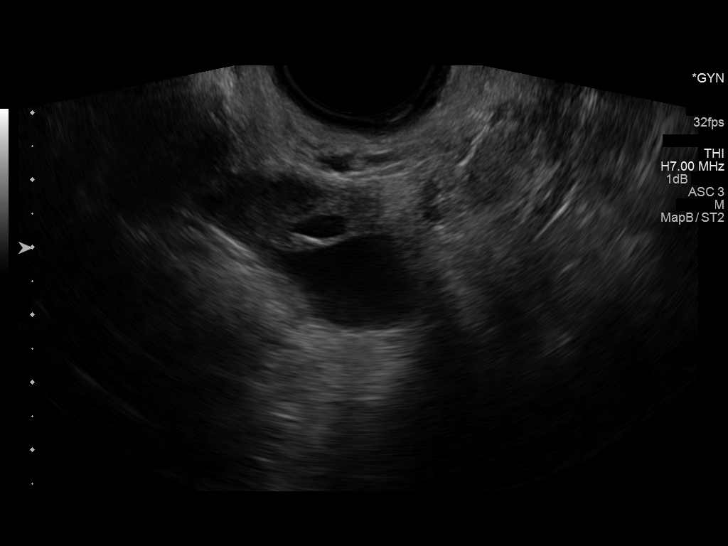
[im 107/107]
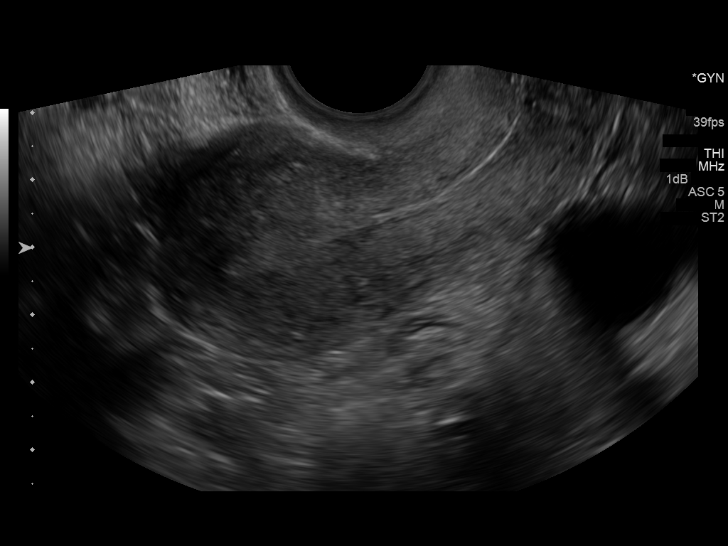

[13 of 25 positions shown; findings below may reference images not displayed]

FINDINGS: Uterus

Measurements: 7.1 by 3.5 by 4.9 cm. There is a 1.1 by 1.4 by 0.9 cm
posterior fundal mass lesion favoring fibroid..

Endometrium

Thickness: 3 mm.  No focal abnormality visualized.

Right ovary

Measurements: 2.6 by 1.5 by 1.5 cm.. This does not include the
by 2.1 by 4.8 cm bilobed cyst along the margin of the ovary, which
may well have a thickened wall at some points up to 4 mm and which
has mild marginal irregularity.

Left ovary

Measurements: 3.6 by 2.1 by 2.9 cm.. This measurement does include
the 1.8 by 1.3 by 1.7 cm peripheral cyst or follicle of the left
ovary, which has a simple appearance.

Pulsed Doppler evaluation of both ovaries demonstrates normal
low-resistance arterial and venous waveforms. The

Other findings

No free fluid.
IMPRESSION: 1. No ovarian torsion identified.
2. Large bilobed cystic lesion along the right adnexa, with some
wall thickening and mild marginal irregularity. This could represent
a thickened/inflamed fallopian tube, a complex cyst, or conceivably
a cystic neoplasm. By report the patient has had prior laparoscopic
surgery with removal of cystic lesions, and correlation to the
findings at the time of prior surgery is suggested. Were prior
lesions endometriomas? If this lesion merits further imaging workup
based on the clinical scenario, ovarian protocol MRI with and
without contrast would be the most specific imaging method for
further workup.
3. Fairly simple appearing 1.8 cm peripheral cyst or follicle of the
left ovary.
4. 1.4 cm uterine fundal fibroid.
